# Patient Record
Sex: Female | Born: 1990 | Race: Black or African American | Hispanic: No | Marital: Single | Smoking: Never smoker
Health system: Southern US, Community
[De-identification: ages and names within clinical notes are randomized; demographics above are authoritative.]

## PROBLEM LIST (undated history)

## (undated) DIAGNOSIS — Z789 Other specified health status: Secondary | ICD-10-CM

## (undated) DIAGNOSIS — R112 Nausea with vomiting, unspecified: Secondary | ICD-10-CM

## (undated) DIAGNOSIS — Z9889 Other specified postprocedural states: Secondary | ICD-10-CM

## (undated) HISTORY — PX: NO PAST SURGERIES: SHX2092

## (undated) HISTORY — DX: Nausea with vomiting, unspecified: R11.2

## (undated) HISTORY — DX: Other specified postprocedural states: Z98.890

## (undated) HISTORY — DX: Nausea with vomiting, unspecified: Z98.890

---

## 2015-11-04 LAB — CBC
HCT: 43.7
HGB: 14.6 g/dL
MCV: 87
PLATELETS: 249
RBC: 5.03
RDW: 12.8
WBC: 5.4

## 2016-01-04 ENCOUNTER — Ambulatory Visit: Payer: Medicaid Other | Attending: Family Medicine | Admitting: Family Medicine

## 2016-01-04 ENCOUNTER — Encounter: Payer: Self-pay | Admitting: Family Medicine

## 2016-01-04 VITALS — BP 115/75 | HR 93 | Temp 98.6°F | Resp 16 | Wt 137.0 lb

## 2016-01-04 DIAGNOSIS — I8390 Asymptomatic varicose veins of unspecified lower extremity: Secondary | ICD-10-CM | POA: Insufficient documentation

## 2016-01-04 DIAGNOSIS — I8393 Asymptomatic varicose veins of bilateral lower extremities: Secondary | ICD-10-CM

## 2016-01-04 DIAGNOSIS — N926 Irregular menstruation, unspecified: Secondary | ICD-10-CM | POA: Diagnosis not present

## 2016-01-04 DIAGNOSIS — R1013 Epigastric pain: Secondary | ICD-10-CM | POA: Insufficient documentation

## 2016-01-04 HISTORY — DX: Asymptomatic varicose veins of unspecified lower extremity: I83.90

## 2016-01-04 LAB — POCT URINE PREGNANCY: Preg Test, Ur: NEGATIVE

## 2016-01-04 MED ORDER — ACETAMINOPHEN ER 650 MG PO TBCR: 650.0000 mg | EXTENDED_RELEASE_TABLET | Freq: Three times a day (TID) | ORAL | Status: DC | PRN

## 2016-01-04 MED ORDER — OMEPRAZOLE 40 MG PO CPDR: 40.0000 mg | DELAYED_RELEASE_CAPSULE | Freq: Every day | ORAL | Status: DC

## 2016-01-04 NOTE — Assessment & Plan Note (Signed)
Painful varicose veins Tylenol Weight loss via exercise

## 2016-01-04 NOTE — Progress Notes (Signed)
Used [employer] Interpreter [place]# [id number] Pain on legs x 1 year or more  Pain scale # unsure  No tobacco user

## 2016-01-04 NOTE — Patient Instructions (Signed)
Sarah Koch was seen today for abdominal pain.  Diagnoses and all orders for this visit:  Irregular menses -     POCT urine pregnancy  Abdominal pain, epigastric -     omeprazole (PRILOSEC) 40 MG capsule; Take 1 capsule (40 mg total) by mouth daily. -     H. pylori breath test -     COMPLETE METABOLIC PANEL WITH GFR -     Lipase  Varicose vein of leg -     acetaminophen (TYLENOL 8 HOUR) 650 MG CR tablet; Take 1 tablet (650 mg total) by mouth every 8 (eight) hours as needed for pain.    F/u in 6 weeks for GERD  Dr. Armen PickupFunches   Food Choices for Gastroesophageal Reflux Disease, Adult When you have gastroesophageal reflux disease (GERD), the foods you eat and your eating habits are very important. Choosing the right foods can help ease the discomfort of GERD. WHAT GENERAL GUIDELINES DO I NEED TO FOLLOW?  Choose fruits, vegetables, whole grains, low-fat dairy products, and low-fat meat, fish, and poultry.  Limit fats such as oils, salad dressings, butter, nuts, and avocado.  Keep a food diary to identify foods that cause symptoms.  Avoid foods that cause reflux. These may be different for different people.  Eat frequent small meals instead of three large meals each day.  Eat your meals slowly, in a relaxed setting.  Limit fried foods.  Cook foods using methods other than frying.  Avoid drinking alcohol.  Avoid drinking large amounts of liquids with your meals.  Avoid bending over or lying down until 2-3 hours after eating. WHAT FOODS ARE NOT RECOMMENDED? The following are some foods and drinks that may worsen your symptoms: Vegetables Tomatoes. Tomato juice. Tomato and spaghetti sauce. Chili peppers. Onion and garlic. Horseradish. Fruits Oranges, grapefruit, and lemon (fruit and juice). Meats High-fat meats, fish, and poultry. This includes hot dogs, ribs, ham, sausage, salami, and bacon. Dairy Whole milk and chocolate milk. Sour cream. Cream. Butter. Ice cream. Cream  cheese.  Beverages Coffee and tea, with or without caffeine. Carbonated beverages or energy drinks. Condiments Hot sauce. Barbecue sauce.  Sweets/Desserts Chocolate and cocoa. Donuts. Peppermint and spearmint. Fats and Oils High-fat foods, including [place] fries and potato chips. Other Vinegar. Strong spices, such as black pepper, white pepper, red pepper, cayenne, curry powder, cloves, ginger, and chili powder. The items listed above may not be a complete list of foods and beverages to avoid. Contact your dietitian for more information.   This information is not intended to replace advice given to you by your health care provider. Make sure you discuss any questions you have with your health care provider.   Document Released: 09/04/2005 Document Revised: 09/25/2014 Document Reviewed: 07/09/2013 Elsevier Interactive Patient Education [employer]2016 Elsevier Inc.

## 2016-01-04 NOTE — Progress Notes (Signed)
LOGO@  Subjective:  Patient ID: Sarah Koch, female    DOB: 10/23/1990  Age: 25 y.o. MRN: [id number]  [place]rinian interpreter used   CC: Abdominal Pain   HPI Sarah StairsFerub Tsuchiya has no significant past medical history. She [place] from [place] [facility] been [facility]. She comes today with a copy of the referral and her CBC. She complains of:   1. Abdominal pain: 1 year. Epigastric pain. She has had vomiting and nausea. She has pain everyday. Pain is exacerbated by eating. She is from [place] originally and has lived in [place], [place] for 3 months. Prior to moving to [place] she has lived in [place]. Pain is better with time. She last vomited today. Yellow in color. Small volume.   2. L calf pain: she denies injury. She has varicose veins. She has been gaining weight. She denies L knee swelling. She has not tried treatment.   No past medical history on file.  No past surgical history on file.  No family history on file.  Social History  Substance Use Topics  . Smoking status: Never Smoker   . Smokeless tobacco: Not on file  . Alcohol Use: No    ROS Review of Systems  Constitutional: Negative for fever and chills.  Eyes: Negative for visual disturbance.  Respiratory: Positive for cough. Negative for shortness of breath.   Cardiovascular: Negative for chest pain.  Gastrointestinal: Positive for abdominal pain. Negative for blood in stool.  Musculoskeletal: Positive for myalgias and back pain. Negative for arthralgias.  Skin: Negative for rash.  Allergic/Immunologic: Negative for immunocompromised state.  Hematological: Negative for adenopathy. Does not bruise/bleed easily.  Psychiatric/Behavioral: Negative for suicidal ideas and dysphoric mood.    Objective:  Today's Vitals: BP 115/75 mmHg  Pulse 93  Temp(Src) 98.6 F (37 C) (Oral)  Resp 16  Wt 137 lb (62.143 kg)  SpO2 99%  LMP 10/12/2015  Physical Exam  Constitutional: She is  oriented to person, place, and time. She appears well-developed and well-nourished. No distress.  HENT:  Head: Normocephalic and atraumatic.  Cardiovascular: Normal rate, regular rhythm, normal heart sounds and intact distal pulses.   Pulmonary/Chest: Effort normal and breath sounds normal.  Musculoskeletal: She exhibits no edema.  Neurological: She is alert and oriented to person, place, and time.  Skin: Skin is warm and dry. No rash noted.     Psychiatric: She has a normal mood and affect.   U preg: negative   Assessment & Plan:   Problem List Items Addressed This Visit    Varicose vein of leg   Relevant Medications   acetaminophen (TYLENOL 8 HOUR) 650 MG CR tablet   Abdominal pain, epigastric   Relevant Medications   omeprazole (PRILOSEC) 40 MG capsule   Other Relevant Orders   H. pylori breath test   COMPLETE METABOLIC PANEL WITH GFR   Lipase    Other Visit Diagnoses    Irregular menses    -  Primary    Relevant Orders    POCT urine pregnancy (Completed)       No outpatient encounter prescriptions on file as of 01/04/2016.   No facility-administered encounter medications on file as of 01/04/2016.    Follow-up: No Follow-up on file.    Dessa PhiJosalyn Makaylin Carlo MD

## 2016-01-04 NOTE — Assessment & Plan Note (Signed)
Suspect GERD H pylori breath test done today Add prilosec Info on GERD diet

## 2016-01-05 LAB — COMPLETE METABOLIC PANEL WITH GFR
ALBUMIN: 4.3 g/dL (ref 3.6–5.1)
ALK PHOS: 59 U/L (ref 33–115)
ALT: 11 U/L (ref 6–29)
AST: 15 U/L (ref 10–30)
BILIRUBIN TOTAL: 0.3 mg/dL (ref 0.2–1.2)
BUN: 8 mg/dL (ref 7–25)
CALCIUM: 9.3 mg/dL (ref 8.6–10.2)
CO2: 27 mmol/L (ref 20–31)
CREATININE: 0.63 mg/dL (ref 0.50–1.10)
Chloride: 102 mmol/L (ref 98–110)
GFR, Est African American: >89 mL/min (ref >=60)
GFR, Est Non African American: >89 mL/min (ref >=60)
Glucose, Bld: 88 mg/dL (ref 65–99)
Potassium: 4.2 mmol/L (ref 3.5–5.3)
SODIUM: 139 mmol/L (ref 135–146)
TOTAL PROTEIN: 7.3 g/dL (ref 6.1–8.1)

## 2016-01-05 LAB — LIPASE: LIPASE: 45 U/L (ref 7–60)

## 2016-01-05 LAB — H. PYLORI BREATH TEST: H. pylori Breath Test: NOT DETECTED

## 2016-01-06 ENCOUNTER — Telehealth: Payer: Self-pay | Admitting: *Deleted

## 2016-01-06 NOTE — Telephone Encounter (Signed)
Used [employer] Interpreter [place] [id number]#[id number] Date of birth verified by pt  Normal lab results given  Pt verbalized understanding

## 2016-01-06 NOTE — Telephone Encounter (Signed)
-----   Message from Dessa PhiJosalyn Funches, MD sent at 01/05/2016  1:11 PM EDT ----- All labs normal

## 2018-02-26 ENCOUNTER — Encounter (HOSPITAL_COMMUNITY): Payer: Self-pay | Admitting: Emergency Medicine

## 2018-02-26 ENCOUNTER — Ambulatory Visit (HOSPITAL_COMMUNITY)
Admission: EM | Admit: 2018-02-26 | Discharge: 2018-02-26 | Disposition: A | Discharge status: Home / Self Care | Payer: BLUE CROSS/BLUE SHIELD | Attending: Family Medicine | Admitting: Family Medicine

## 2018-02-26 ENCOUNTER — Other Ambulatory Visit: Payer: Self-pay

## 2018-02-26 DIAGNOSIS — N926 Irregular menstruation, unspecified: Secondary | ICD-10-CM

## 2018-02-26 DIAGNOSIS — L74510 Primary focal hyperhidrosis, axilla: Secondary | ICD-10-CM

## 2018-02-26 MED ORDER — ALUMINUM CHLORIDE HEXAHYDRATE CRYS: CRYSTALS | 0 refills | Status: DC

## 2018-02-26 NOTE — ED Provider Notes (Signed)
[facility]    CSN: [id number] Arrival date & time: 02/26/18  1752     History   Chief Complaint Chief Complaint  Patient presents with  . Abscess    HPI Sarah Koch is a 27 y.o. female.   Sarah Koch presents with family with complaints of excessive moisture and odor to bilateral armpits. Has been ongoing for two months. Denies any previous similar. No fevers. Occasionally uses antiperspirant/deodorant. States also has irregular periods. Has never been sexually active. No period in two months. Has had this is the past. No change. No pain. Does not follow with a PCP. Without contributing medical history.      ROS per HPI.      History reviewed. No pertinent past medical history.  Patient Active Problem List   Diagnosis Date Noted  . Abdominal pain, epigastric 01/04/2016  . Varicose vein of leg 01/04/2016    History reviewed. No pertinent surgical history.  OB History   None      Home Medications    Prior to Admission medications   Medication Sig Start Date End Date Taking? Authorizing Provider  Aluminum Chloride Hexahydrate CRYS Apply nightly to armpits 02/26/18   Georgetta HaberBurky, Natalie B, NP    Family History History reviewed. No pertinent family history.  Social History Social History   Tobacco Use  . Smoking status: Never Smoker  . Smokeless tobacco: Never Used  Substance Use Topics  . Alcohol use: No    Alcohol/week: 0.0 oz  . Drug use: No     Allergies   Patient has no known allergies.   Review of Systems Review of Systems   Physical Exam Triage Vital Signs ED Triage Vitals  Enc Vitals Group     BP 02/26/18 1924 125/67     Pulse Rate 02/26/18 1924 84     Resp 02/26/18 1924 18     Temp 02/26/18 1924 99.8 F (37.7 C)     Temp Source 02/26/18 1924 Oral     SpO2 02/26/18 1924 100 %     Weight --      Height --      Head Circumference --      Peak Flow --      Pain Score 02/26/18 1925 4     Pain Loc --      Pain Edu? --    Excl. in GC? --    No data found.  Updated Vital Signs BP 125/67 (BP Location: Left Arm)   Pulse 84   Temp 99.8 F (37.7 C) (Oral)   Resp 18   LMP 01/01/2018   SpO2 100%    Physical Exam  Constitutional: She is oriented to person, place, and time. She appears well-developed and well-nourished. No distress.  Cardiovascular: Normal rate, regular rhythm and normal heart sounds.  Pulmonary/Chest: Effort normal and breath sounds normal.  Bilateral axilla without any acute findings. Non tender, no masses, no redness or skin breakdown   Neurological: She is alert and oriented to person, place, and time.  Skin: Skin is warm and dry.     UC Treatments / Results  Labs (all labs ordered are listed, but only abnormal results are displayed) Labs Reviewed - No data to display  EKG None  Radiology No results found.  Procedures Procedures (including critical care time)  Medications Ordered in UC Medications - No data to display  Initial Impression / Assessment and Plan / UC Course  I have reviewed the triage vital signs and the  nursing notes.  Pertinent labs & imaging results that were available during my care of the patient were reviewed by me and considered in my medical decision making (see chart for details).     Encouraged regular use of deodorant. May try nightly aluminum chloride hexahydrate to better control sweating and odor. Patient quite adamant that she is not sexually active, has historically had irregular periods, no urine pregnancy tonight. Encouraged follow up for recheck with pcp. Patient verbalized understanding and agreeable to plan.  Ambulatory out of clinic without difficulty.   Final Clinical Impressions(s) / UC Diagnoses   Final diagnoses:  Hyperhidrosis of axilla  Irregular menstrual cycle     Discharge Instructions     May try prescribed medication to help with odor and sweating, apply nightly.  Please follow up with primary care provider for  recheck and for recheck of menstrual patterns.    ED Prescriptions    Medication Sig Dispense Auth. Provider   Aluminum Chloride Hexahydrate CRYS Apply nightly to armpits 25 g Linus Mako B, NP     Controlled Substance Prescriptions [place] Controlled Substance Registry consulted? Not Applicable   Georgetta Haber, NP 02/26/18 1947

## 2018-02-26 NOTE — ED Triage Notes (Signed)
[facility] with a complaint of a possible abscess under her right and left arms x 2 months.

## 2018-02-26 NOTE — Discharge Instructions (Addendum)
May try prescribed medication to help with odor and sweating, apply nightly.  Please follow up with primary care provider for recheck and for recheck of menstrual patterns.

## 2018-02-27 ENCOUNTER — Telehealth (HOSPITAL_COMMUNITY): Payer: Self-pay | Admitting: Emergency Medicine

## 2018-02-27 MED ORDER — ALUMINUM CHLORIDE HEXAHYDRATE CRYS: CRYSTALS | 0 refills | Status: DC

## 2018-02-27 NOTE — Telephone Encounter (Signed)
RX sent to [facility] per pt request

## 2018-10-21 DIAGNOSIS — L74510 Primary focal hyperhidrosis, axilla: Secondary | ICD-10-CM | POA: Insufficient documentation

## 2018-10-21 HISTORY — DX: Primary focal hyperhidrosis, axilla: L74.510

## 2020-12-19 ENCOUNTER — Ambulatory Visit (HOSPITAL_COMMUNITY)
Admission: EM | Admit: 2020-12-19 | Discharge: 2020-12-19 | Disposition: A | Discharge status: Other Acute Inpatient Hospital | Payer: BC Managed Care – PPO | Attending: Internal Medicine | Admitting: Internal Medicine

## 2020-12-19 ENCOUNTER — Inpatient Hospital Stay (HOSPITAL_COMMUNITY)
Admission: AD | Admit: 2020-12-19 | Discharge: 2020-12-19 | Disposition: A | Discharge status: Home / Self Care | Payer: BC Managed Care – PPO | Attending: Obstetrics and Gynecology | Admitting: Obstetrics and Gynecology

## 2020-12-19 ENCOUNTER — Inpatient Hospital Stay (HOSPITAL_COMMUNITY): Payer: BC Managed Care – PPO

## 2020-12-19 ENCOUNTER — Encounter (HOSPITAL_COMMUNITY): Payer: Self-pay | Admitting: Obstetrics and Gynecology

## 2020-12-19 DIAGNOSIS — O034 Incomplete spontaneous abortion without complication: Secondary | ICD-10-CM

## 2020-12-19 DIAGNOSIS — O26859 Spotting complicating pregnancy, unspecified trimester: Secondary | ICD-10-CM

## 2020-12-19 DIAGNOSIS — Z3A11 11 weeks gestation of pregnancy: Secondary | ICD-10-CM | POA: Diagnosis not present

## 2020-12-19 DIAGNOSIS — O26891 Other specified pregnancy related conditions, first trimester: Secondary | ICD-10-CM | POA: Insufficient documentation

## 2020-12-19 DIAGNOSIS — Z3201 Encounter for pregnancy test, result positive: Secondary | ICD-10-CM

## 2020-12-19 DIAGNOSIS — O99891 Other specified diseases and conditions complicating pregnancy: Secondary | ICD-10-CM

## 2020-12-19 DIAGNOSIS — M549 Dorsalgia, unspecified: Secondary | ICD-10-CM | POA: Insufficient documentation

## 2020-12-19 DIAGNOSIS — Z3A12 12 weeks gestation of pregnancy: Secondary | ICD-10-CM | POA: Diagnosis not present

## 2020-12-19 DIAGNOSIS — B9689 Other specified bacterial agents as the cause of diseases classified elsewhere: Secondary | ICD-10-CM

## 2020-12-19 DIAGNOSIS — O209 Hemorrhage in early pregnancy, unspecified: Secondary | ICD-10-CM | POA: Diagnosis present

## 2020-12-19 DIAGNOSIS — Z789 Other specified health status: Secondary | ICD-10-CM

## 2020-12-19 LAB — WET PREP, GENITAL
Sperm: NONE SEEN
Trich, Wet Prep: NONE SEEN
Yeast Wet Prep HPF POC: NONE SEEN

## 2020-12-19 LAB — URINALYSIS, ROUTINE W REFLEX MICROSCOPIC
Bilirubin Urine: NEGATIVE
Glucose, UA: NEGATIVE mg/dL
Hgb urine dipstick: NEGATIVE
Ketones, ur: NEGATIVE mg/dL
Nitrite: NEGATIVE
Protein, ur: NEGATIVE mg/dL
Specific Gravity, Urine: 1.009 (ref 1.005–1.030)
pH: 7 (ref 5.0–8.0)

## 2020-12-19 LAB — CBC
HCT: 41.2 % (ref 36.0–46.0)
Hemoglobin: 13.8 g/dL (ref 12.0–15.0)
MCH: 27.9 pg (ref 26.0–34.0)
MCHC: 33.5 g/dL (ref 30.0–36.0)
MCV: 83.2 fL (ref 80.0–100.0)
Platelets: 224 10*3/uL (ref 150–400)
RBC: 4.95 MIL/uL (ref 3.87–5.11)
RDW: 13 % (ref 11.5–15.5)
WBC: 4.5 10*3/uL (ref 4.0–10.5)
nRBC: 0 % (ref 0.0–0.2)

## 2020-12-19 LAB — POC URINE PREG, ED: Preg Test, Ur: POSITIVE — AB

## 2020-12-19 LAB — POCT URINALYSIS DIPSTICK, ED / UC
Bilirubin Urine: NEGATIVE
Glucose, UA: NEGATIVE mg/dL
Nitrite: NEGATIVE
Protein, ur: NEGATIVE mg/dL
Specific Gravity, Urine: 1.025 (ref 1.005–1.030)
Urobilinogen, UA: 0.2 mg/dL (ref 0.0–1.0)
pH: 7 (ref 5.0–8.0)

## 2020-12-19 LAB — ABO/RH: ABO/RH(D): B POS

## 2020-12-19 LAB — HIV ANTIBODY (ROUTINE TESTING W REFLEX): HIV Screen 4th Generation wRfx: NONREACTIVE

## 2020-12-19 LAB — HCG, QUANTITATIVE, PREGNANCY: hCG, Beta Chain, Quant, S: 13067 m[IU]/mL — ABNORMAL HIGH (ref <5)

## 2020-12-19 MED ORDER — IBUPROFEN 600 MG PO TABS: 600.0000 mg | ORAL_TABLET | Freq: Four times a day (QID) | ORAL | 0 refills | Status: DC | PRN

## 2020-12-19 MED ORDER — METRONIDAZOLE 500 MG PO TABS: 500.0000 mg | ORAL_TABLET | Freq: Two times a day (BID) | ORAL | 0 refills | Status: DC

## 2020-12-19 MED ORDER — ACETAMINOPHEN-CODEINE #3 300-30 MG PO TABS: 1.0000 | ORAL_TABLET | Freq: Four times a day (QID) | ORAL | 0 refills | Status: DC | PRN

## 2020-12-19 MED ORDER — PROMETHAZINE HCL 12.5 MG PO TABS: 12.5000 mg | ORAL_TABLET | Freq: Four times a day (QID) | ORAL | 0 refills | Status: DC | PRN

## 2020-12-19 NOTE — MAU Provider Note (Signed)
History     CSN: [id number]  Arrival date and time: 12/19/20 1813   Event Date/Time   First Provider Initiated Contact with Patient 12/19/20 2002      Chief Complaint  Patient presents with  . Vaginal Bleeding  . Back Pain   [name] is a 30 y.o. year old G1P0 female at [redacted]w[redacted]d weeks gestation who presents to MAU reporting intermittent vaginal bleeding; "not a lot." She reports passing a quarter-sized blood clot earlier today. She also report LT back pain, cramps and pressure. All of her sx's started Wednesday. She denies recent SI.   OB History    Gravida  1   Para      Term      Preterm      AB      Living        SAB      IAB      Ectopic      Multiple      Live Births              History reviewed. No pertinent past medical history.  History reviewed. No pertinent surgical history.  History reviewed. No pertinent family history.  Social History   Tobacco Use  . Smoking status: Never Smoker  . Smokeless tobacco: Never Used  Vaping Use  . Vaping Use: Never used  Substance Use Topics  . Alcohol use: No    Alcohol/week: 0.0 standard drinks  . Drug use: No    Allergies: No Known Allergies  Medications Prior to Admission  Medication Sig Dispense Refill Last Dose  . ferrous sulfate 324 MG TBEC Take 324 mg by mouth.   12/19/2020 at Unknown time  . Aluminum Chloride Hexahydrate CRYS Apply nightly to armpits 25 g 0     Review of Systems  Constitutional: Negative.   HENT: Negative.   Eyes: Negative.   Respiratory: Negative.   Cardiovascular: Negative.   Gastrointestinal: Negative.   Endocrine: Negative.   Genitourinary: Positive for pelvic pain (cramps) and vaginal bleeding (passed quarter-sized blood clort earlier; not alot of bleeding now).  Musculoskeletal: Positive for back pain.  Skin: Negative.   Allergic/Immunologic: Negative.   Neurological: Negative.   Hematological: Negative.   Psychiatric/Behavioral: Negative.     Physical Exam   Blood pressure 109/72, pulse 100, temperature 98.3 F (36.8 C), temperature source Oral, resp. rate 16, height 5' 2.99" (1.6 m), weight 81.8 kg, last menstrual period 09/27/2020, SpO2 100 %.  Physical Exam Vitals and nursing note reviewed. Exam conducted with a chaperone present.  Constitutional:      Appearance: Normal appearance. She is obese.  Abdominal:     Palpations: Abdomen is soft.  Genitourinary:    Comments: Pelvic exam: External genitalia normal, SE: vaginal walls pink and well rugated, cervix is smooth, pink, no lesions, small amt of thick, white vaginal d/c -- WP, GC/CT done, cervix visually closed with some mucousy blood coming from os, Uterus is non-tender, no CMT or friability, no adnexal tenderness.  Musculoskeletal:        General: Normal range of motion.  Skin:    General: Skin is warm and dry.  Neurological:     Mental Status: She is alert.  Psychiatric:        Mood and Affect: Mood normal.        Behavior: Behavior normal.        Thought Content: Thought content normal.        Judgment: Judgment normal.  Unable to obtain FHTs with doppler MAU Course  Procedures  MDM CCUA UPT CBC ABO/Rh HCG Wet Prep GC/CT -- pending HIV -- pending OB < 14 wks [place] with TV Offered Cytotec or expectant management -- patient chooses expectant management  Results for orders placed or performed during the hospital encounter of 12/19/20 (from the past 24 hour(s))  CBC     Status: None   Collection Time: 12/19/20  8:09 PM  Result Value Ref Range   WBC 4.5 4.0 - 10.5 K/uL   RBC 4.95 3.87 - 5.11 MIL/uL   Hemoglobin 13.8 12.0 - 15.0 g/dL   HCT 84.6 96.2 - 95.2 %   MCV 83.2 80.0 - 100.0 fL   MCH 27.9 26.0 - 34.0 pg   MCHC 33.5 30.0 - 36.0 g/dL   RDW 84.1 32.4 - 40.1 %   Platelets 224 150 - 400 K/uL   nRBC 0.0 0.0 - 0.2 %  ABO/Rh     Status: None   Collection Time: 12/19/20  8:09 PM  Result Value Ref Range   ABO/RH(D) B POS    No rh immune  globuloin      NOT A RH IMMUNE GLOBULIN CANDIDATE, PT RH POSITIVE Performed at [facility], [address] [place], [place] [address]   Wet prep, genital     Status: Abnormal   Collection Time: 12/19/20  8:23 PM   Specimen: Urine, Clean Catch  Result Value Ref Range   Yeast Wet Prep HPF POC NONE SEEN NONE SEEN   Trich, Wet Prep NONE SEEN NONE SEEN   Clue Cells Wet Prep HPF POC PRESENT (A) NONE SEEN   WBC, Wet Prep HPF POC MANY (A) NONE SEEN   Sperm NONE SEEN   Urinalysis, Routine w reflex microscopic Urine, Clean Catch     Status: Abnormal   Collection Time: 12/19/20  8:49 PM  Result Value Ref Range   Color, Urine YELLOW YELLOW   APPearance HAZY (A) CLEAR   Specific Gravity, Urine 1.009 1.005 - 1.030   pH 7.0 5.0 - 8.0   Glucose, UA NEGATIVE NEGATIVE mg/dL   Hgb urine dipstick NEGATIVE NEGATIVE   Bilirubin Urine NEGATIVE NEGATIVE   Ketones, ur NEGATIVE NEGATIVE mg/dL   Protein, ur NEGATIVE NEGATIVE mg/dL   Nitrite NEGATIVE NEGATIVE   Leukocytes,Ua MODERATE (A) NEGATIVE   RBC / HPF 0-5 0 - 5 RBC/hpf   WBC, UA 6-10 0 - 5 WBC/hpf   Bacteria, UA RARE (A) NONE SEEN   Squamous Epithelial / LPF 11-20 0 - 5   Mucus PRESENT     US OB Comp Less 14 Wks  Result Date: 12/19/2020 CLINICAL DATA:  Vaginal bleeding and back pain. EXAM: OBSTETRIC <14 WK ULTRASOUND TECHNIQUE: Transabdominal ultrasound was performed for evaluation of the gestation as well as the maternal uterus and adnexal regions. COMPARISON:  None. FINDINGS: Intrauterine gestational sac: Single Yolk sac:  Not Visualized. Embryo:  Visualized. Cardiac Activity: Not Visualized. Heart Rate: N/A bpm CRL:   13.5 mm   7 w 4 d                  [place] [facility]: August 03, 2021 Subchorionic hemorrhage:  Small. Maternal uterus/adnexae: The right and left ovaries are not clearly visualized. No pelvic free fluid is seen. IMPRESSION: Single, nonviable intrauterine pregnancy, at approximately 7 weeks and 4 days gestation by ultrasound evaluation.  Electronically Signed   By: Aram Candela M.D.   On: 12/19/2020 20:58  Assessment and Plan  Incomplete miscarriage - Discussed will have bleeding and cramping until pregnancy has passed on it's own - Advised to return to MAU for increased VB [redacted] weeks gestation of pregnancy  Back pain affecting pregnancy in first trimester - Advised to take Ibuprofen or Tylenol #3 prn pain  Spotting in early pregnancy - Explained return to MAU:  If you have heavier bleeding that soaks through more that 2 pads per hour for an hour or more  If you bleed so much that you feel like you might pass out or you do pass out  If you have significant abdominal pain that is not improved with Tylenol 1000 mg every 6 hours as needed for pain  If you develop a fever > 100.5   Language barrier affecting health care - [employer], #[id number] used for entire visit   Raelyn Mora, CNM 12/19/2020, 8:02 PM

## 2020-12-19 NOTE — ED Triage Notes (Signed)
Pt presented with reported 2 months pregnant  And having Vag bleeding. Pt gave urine for Pregnancy  test . Results positive . [facility]

## 2020-12-19 NOTE — MAU Note (Signed)
..  Sarah Koch is a 30 y.o. at Unknown here in MAU reporting: Pregnant and there is vaginal bleeding that comes and goes. She says that it is not a lot of bleeding but she had a quartersized clot come out earlier today. Denies abdominal pain but does have left sided back pain that feels like cramps and pressure.  LMP: 09/27/2020 Onset of complaint: Wednesday Pain score: 5/10 Vitals:   12/19/20 1928  BP: 109/72  Pulse: 100  Resp: 16  Temp: 98.3 F (36.8 C)  SpO2: 100%     FHR: Attempted to doppler unable to auscultate FHR provider notified.  Lab orders placed from triage: UA

## 2020-12-20 LAB — GC/CHLAMYDIA PROBE AMP (~~LOC~~) NOT AT ARMC
Chlamydia: NEGATIVE
Comment: NEGATIVE
Comment: NORMAL
Neisseria Gonorrhea: NEGATIVE

## 2020-12-27 ENCOUNTER — Other Ambulatory Visit: Payer: BC Managed Care – PPO

## 2020-12-29 ENCOUNTER — Other Ambulatory Visit: Payer: Self-pay

## 2020-12-29 ENCOUNTER — Other Ambulatory Visit: Payer: BC Managed Care – PPO

## 2020-12-29 DIAGNOSIS — O034 Incomplete spontaneous abortion without complication: Secondary | ICD-10-CM

## 2020-12-30 LAB — BETA HCG QUANT (REF LAB): hCG Quant: 75 m[IU]/mL

## 2021-01-03 ENCOUNTER — Encounter: Payer: Self-pay | Admitting: Advanced Practice Midwife

## 2021-01-03 ENCOUNTER — Ambulatory Visit (INDEPENDENT_AMBULATORY_CARE_PROVIDER_SITE_OTHER): Payer: BC Managed Care – PPO | Admitting: Advanced Practice Midwife

## 2021-01-03 ENCOUNTER — Other Ambulatory Visit: Payer: Self-pay

## 2021-01-03 ENCOUNTER — Telehealth: Payer: Self-pay | Admitting: Lactation Services

## 2021-01-03 VITALS — BP 124/80 | HR 87 | Wt 179.6 lb

## 2021-01-03 DIAGNOSIS — O034 Incomplete spontaneous abortion without complication: Secondary | ICD-10-CM

## 2021-01-03 NOTE — Telephone Encounter (Signed)
Called patient with assistance of [employer] [place], [place] # [phone].   Patient did not answer. LM for patient to call the office for results.   Patient was seen at [facility] today and Hcg was repeated at that time. Will forward message to [facility].

## 2021-01-03 NOTE — Telephone Encounter (Signed)
-----   Message from Warden Fillers, MD sent at 01/03/2021  9:07 AM EDT ----- Bhcg decreased to 75, advise recheck in 2 weeks

## 2021-01-03 NOTE — Patient Instructions (Signed)
Miscarriage A miscarriage is the loss of a pregnancy before the 20th week of pregnancy. Sometimes, a pregnancy ends before a woman knows that she is pregnant. If you lose a pregnancy, talk with your doctor about:  Questions you have about the loss of your baby.  How to work through your grief.  Plans for future pregnancy. What are the causes? Many times, the cause of this condition is not known. What increases the risk? These things may make a pregnant woman more likely to lose a pregnancy: Certain health conditions  Conditions that affect hormones, such as: ? Thyroid disease. ? Polycystic ovary syndrome.  Diabetes.  A disease that causes the body's disease-fighting system to attack itself by mistake.  Infections.  Bleeding problems.  Being very overweight. Lifestyle factors  Using products that have tobacco or nicotine in them.  Being around tobacco smoke.  Having alcohol.  Having a lot of caffeine.  Using drugs. Problems with reproductive organs or parts  Having a cervix that opens and thins before your due date. The cervix is the lowest part of your womb.  Having Asherman syndrome, which leads to: ? Scars in the womb. ? The womb being abnormal in shape.  Growths (fibroids) in the womb.  Problems in the body that are present at birth.  Infection of the cervix or womb. Personal or health history  Injury.  Having lost a pregnancy before.  Being younger than age 18 or older than age 35.  Being around a harmful substance, such as radiation.  Having lead or other heavy metals in: ? Things you eat or drink. ? The air around you.  Using certain medicines. What are the signs or symptoms?  Blood or spots of blood coming from the vagina. You may also have cramps or pain.  Pain or cramps in the belly or low back.  Fluid or tissue coming out of the vagina. How is this treated? Sometimes, treatment is not needed. If you need treatment, you may be  treated with:  A procedure to open the cervix more and take tissue out of the womb.  Medicines. You may get a shot of medicine called Rho(D) immune globulin. Follow these instructions at home: Medicines  Take over-the-counter and prescription medicines only as told by your doctor.  If you were prescribed antibiotic medicine, take it as told by your doctor. Do not stop taking it even if you start to feel better. Activity  Rest as told by your doctor. Ask your doctor what activities are safe for you.  Have someone help you at home during this time. General instructions  Watch how much tissue comes out of the vagina.  Watch the size of any blood clots that come out of the vagina.  Do not have sex or douche until your doctor says it is okay.  Do not put things, such as tampons, in your vagina until your doctor says it is okay.  To help you and your partner with grieving: ? Talk with your doctor. ? See a counselor.  When you are ready, talk with your doctor about: ? Things to do for your health. ? How you can be healthy if you get pregnant again.  Keep all follow-up visits.   Where to find more information  The American College of Obstetricians and Gynecologists: acog.org  [place] Department of Health and Human Services Office of Women's Health: hrsa.gov/office-womens-health Contact a doctor if:  You have a fever or chills.  There is bad-smelling fluid coming   from the vagina.  You have more bleeding.  Tissue or clots of blood come out of your vagina. Get help right away if:  You have very bad cramps or pain in your back or belly.  You soak more than 2 large pads in an hour for more than 2 hours.  You get light-headed or weak.  You faint.  You feel sad, and you have sad thoughts a lot of the time.  You think about hurting yourself. Get help right awayif you feel like you may hurt yourself or others, or have thoughts about taking your own life. Go to your nearest  emergency room or:  Call your local emergency services (911 in the [place]  Call the National Suicide Prevention Lifeline at 1-800-273-8255. This is open 24 hours a day.  Text the Crisis Text Line at 741741. Summary  A miscarriage is the loss of a pregnancy before the 20th week of pregnancy. Sometimes, a pregnancy ends before a woman knows that she is pregnant.  Follow instructions from your doctor about medicines and activity.  To help you and your partner with grieving, talk with your doctor or a counselor.  Keep all follow-up visits. This information is not intended to replace advice given to you by your health care provider. Make sure you discuss any questions you have with your health care provider. Document Revised: 03/05/2020 Document Reviewed: 03/05/2020 Elsevier Patient Education  2021 Elsevier Inc.  

## 2021-01-03 NOTE — Progress Notes (Signed)
OB/GYN PROBLEM ENCOUNTER NOTE  History:     Sarah Koch is a 30 y.o. G1P0 female here for followup to first trimester miscarriage. She is s/p miscarriage at [redacted] weeks GA. She states her bleeding resolved two days ago. Denies abnormal vaginal bleeding, discharge, pelvic pain, problems with intercourse or other gynecologic concerns.     Patient states she put herself bed rest on 12/16/2020. She is requesting a work note to allow her to continue staying home from work for three more weeks.  Gynecologic History Patient's last menstrual period was 09/27/2020. Contraception: none   Obstetric History OB History  Gravida Para Term Preterm AB Living  1            SAB IAB Ectopic Multiple Live Births               # Outcome Date GA Lbr Len/2nd Weight Sex Delivery Anes PTL Lv  1 Gravida             No past medical history on file.  No past surgical history on file.  Current Outpatient Medications on File Prior to Visit  Medication Sig Dispense Refill  . metroNIDAZOLE (FLAGYL) 500 MG tablet Take 1 tablet (500 mg total) by mouth 2 (two) times daily. 14 tablet 0  . acetaminophen-codeine (TYLENOL #3) 300-30 MG tablet Take 1-2 tablets by mouth every 6 (six) hours as needed for moderate pain. (Patient not taking: Reported on 01/03/2021) 15 tablet 0  . Aluminum Chloride Hexahydrate CRYS Apply nightly to armpits (Patient not taking: Reported on 01/03/2021) 25 g 0  . ferrous sulfate 324 MG TBEC Take 324 mg by mouth. (Patient not taking: Reported on 01/03/2021)    . ibuprofen (ADVIL) 600 MG tablet Take 1 tablet (600 mg total) by mouth every 6 (six) hours as needed for mild pain or moderate pain. (Patient not taking: Reported on 01/03/2021) 60 tablet 0  . promethazine (PHENERGAN) 12.5 MG tablet Take 1 tablet (12.5 mg total) by mouth every 6 (six) hours as needed for nausea or vomiting. (Patient not taking: Reported on 01/03/2021) 30 tablet 0   No current facility-administered medications on file prior  to visit.    No Known Allergies  Social History:  reports that she has never smoked. She has never used smokeless tobacco. She reports that she does not drink alcohol and does not use drugs.  No family history on file.  The following portions of the patient's history were reviewed and updated as appropriate: allergies, current medications, past family history, past medical history, past social history, past surgical history and problem list.  Review of Systems Pertinent items noted in HPI and remainder of comprehensive ROS otherwise negative.  Physical Exam:  BP 124/80   Pulse 87   Wt 179 lb 9.6 oz (81.5 kg)   LMP 09/27/2020   Breastfeeding Unknown   BMI 31.82 kg/m  CONSTITUTIONAL: Well-developed, well-nourished female in no acute distress.  HENT:  Normocephalic, atraumatic, External right and left ear normal.  EYES: Conjunctivae and EOM are normal. Pupils are equal, round, and reactive to light. No scleral icterus.  SKIN: Skin is warm and dry. No rash noted. Not diaphoretic. No erythema. No pallor. NEUROLOGIC: Alert and oriented to person, place, and time.  PSYCHIATRIC: Normal mood and affect. Normal behavior. Normal judgment and thought content. CARDIOVASCULAR: Normal heart rate noted RESPIRATORY: Effort normal Assessment and Plan:    1. Incomplete miscarriage - S/p quant hCG 75 on 12/29/2020 - No acute concerns (no  bleeding or pain) - No signs of increasing acuity today - Beta hCG quant (ref lab)  Discussed with patient that she is recovering well, does not have medical indication to be absent from work at this time. Letter provided to document MAU visit 12/19/2020.  Routine preventative health maintenance measures emphasized. Please refer to After Visit Summary for other counseling recommendations.      Clayton Bibles, MSN, CNM Certified Nurse Midwife, [employer] for [employer], [facility] 01/03/21 11:19 AM

## 2021-01-04 LAB — BETA HCG QUANT (REF LAB): hCG Quant: 19 m[IU]/mL

## 2021-01-05 ENCOUNTER — Telehealth: Payer: Self-pay | Admitting: *Deleted

## 2021-01-05 NOTE — Telephone Encounter (Signed)
Spoke with [employer] regarding disability claim. Needed dates of service and return to work date.  Notes reviewed and dates given.

## 2021-01-19 ENCOUNTER — Other Ambulatory Visit: Payer: BC Managed Care – PPO

## 2021-01-19 ENCOUNTER — Other Ambulatory Visit: Payer: Self-pay

## 2021-01-19 DIAGNOSIS — O034 Incomplete spontaneous abortion without complication: Secondary | ICD-10-CM

## 2021-01-20 LAB — BETA HCG QUANT (REF LAB): hCG Quant: 4 m[IU]/mL

## 2021-03-18 ENCOUNTER — Ambulatory Visit (INDEPENDENT_AMBULATORY_CARE_PROVIDER_SITE_OTHER): Payer: BC Managed Care – PPO

## 2021-03-18 ENCOUNTER — Other Ambulatory Visit: Payer: Self-pay

## 2021-03-18 VITALS — BP 112/85 | HR 82

## 2021-03-18 DIAGNOSIS — Z3201 Encounter for pregnancy test, result positive: Secondary | ICD-10-CM

## 2021-03-18 LAB — POCT URINE PREGNANCY: Preg Test, Ur: POSITIVE — AB

## 2021-03-18 NOTE — Progress Notes (Signed)
Sarah Koch presents today for UPT. She has no unusual complaints. LMP: 02/07/2021    OBJECTIVE: Appears well, in no apparent distress.  OB History     Gravida  1   Para      Term      Preterm      AB      Living         SAB      IAB      Ectopic      Multiple      Live Births             Home UPT Result: positive In-Office UPT result:positive I have reviewed the patient's medical, obstetrical, social, and family histories, and medications.   ASSESSMENT: Positive pregnancy test  PLAN Prenatal care to be completed SW:[id number] around 10 weeks.

## 2021-04-14 ENCOUNTER — Other Ambulatory Visit: Payer: Self-pay

## 2021-04-14 ENCOUNTER — Ambulatory Visit (INDEPENDENT_AMBULATORY_CARE_PROVIDER_SITE_OTHER): Payer: BC Managed Care – PPO

## 2021-04-14 VITALS — BP 124/80 | HR 106 | Ht 62.99 in | Wt 179.8 lb

## 2021-04-14 DIAGNOSIS — Z3481 Encounter for supervision of other normal pregnancy, first trimester: Secondary | ICD-10-CM

## 2021-04-14 DIAGNOSIS — O3680X0 Pregnancy with inconclusive fetal viability, not applicable or unspecified: Secondary | ICD-10-CM

## 2021-04-14 DIAGNOSIS — O0990 Supervision of high risk pregnancy, unspecified, unspecified trimester: Secondary | ICD-10-CM | POA: Insufficient documentation

## 2021-04-14 DIAGNOSIS — Z3A10 10 weeks gestation of pregnancy: Secondary | ICD-10-CM | POA: Diagnosis not present

## 2021-04-14 DIAGNOSIS — Z3491 Encounter for supervision of normal pregnancy, unspecified, first trimester: Secondary | ICD-10-CM | POA: Insufficient documentation

## 2021-04-14 NOTE — Progress Notes (Signed)
New OB Intake  I connected with  Sarah Koch on 04/14/21 at  3:15 PM EDT by in person. Video Visit and verified that I am speaking with the correct person using two identifiers. Nurse is located at [facility] and pt is located at [place].  I discussed the limitations, risks, security and privacy concerns of performing an evaluation and management service by telephone and the availability of in person appointments. I also discussed with the patient that there may be a patient responsible charge related to this service. The patient expressed understanding and agreed to proceed.  I explained I am completing New OB Intake today. We discussed her EDD of 11/08/21 that is based on early u/s. Pt is G2/P0010. I reviewed her allergies, medications, Medical/Surgical/OB history, and appropriate screenings. I informed her of [facility] services. Based on history, this is a/an  pregnancy uncomplicated .   Patient Active Problem List   Diagnosis Date Noted   Abdominal pain, epigastric 01/04/2016   Varicose vein of leg 01/04/2016    Concerns addressed today  Delivery Plans:  Plans to deliver at [facility].   MyChart/Babyscripts MyChart access verified. I explained pt will have some visits in office and some virtually. Babyscripts instructions given and order placed. Patient verifies receipt of registration text/e-mail. Account successfully created and app downloaded.  Blood Pressure Cuff  Patient has private insurance; instructed to purchase blood pressure cuff and bring to first prenatal appt. Explained after first prenatal appt pt will check weekly and document in Babyscripts.  Weight scale: Patient instructed to purchase scale.  Anatomy US Explained first scheduled [place] will be around 19 weeks. Dating and viability scan performed today.  Labs Discussed Avelina Laine genetic screening with patient. Would like both Panorama and Horizon drawn at new OB visit. Routine prenatal labs needed.  Covid Vaccine Patient has  covid vaccine.   Mother/ Baby Dyad Candidate?    If yes, offer as possibility  Informed patient of [facility] website  and placed link in her AVS.   Social Determinants of Health Food Insecurity: Patient denies food insecurity. WIC Referral: Patient is interested in referral to [facility].  Transportation: Patient denies transportation needs. Childcare: Discussed no children allowed at ultrasound appointments. Offered childcare services; patient declines childcare services at this time.   Placed OB Box on problem list and updated  First visit review I reviewed new OB appt with pt. I explained she will have a pelvic exam, ob bloodwork with genetic screening, and PAP smear. Explained pt will be seen by Sharen Counter at first visit; encounter routed to appropriate provider. Explained that patient will be seen by pregnancy navigator following visit with provider. [facility] information placed in AVS.   Hamilton Capri, RN 04/14/2021  3:43 PM

## 2021-04-25 ENCOUNTER — Ambulatory Visit (INDEPENDENT_AMBULATORY_CARE_PROVIDER_SITE_OTHER): Payer: BC Managed Care – PPO | Admitting: Advanced Practice Midwife

## 2021-04-25 ENCOUNTER — Other Ambulatory Visit: Payer: Self-pay

## 2021-04-25 ENCOUNTER — Encounter: Payer: Self-pay | Admitting: Obstetrics

## 2021-04-25 ENCOUNTER — Encounter: Payer: Self-pay | Admitting: Advanced Practice Midwife

## 2021-04-25 ENCOUNTER — Other Ambulatory Visit (HOSPITAL_COMMUNITY)
Admission: RE | Admit: 2021-04-25 | Discharge: 2021-04-25 | Disposition: A | Discharge status: Home / Self Care | Payer: BC Managed Care – PPO | Source: Ambulatory Visit | Attending: Advanced Practice Midwife | Admitting: Advanced Practice Midwife

## 2021-04-25 VITALS — BP 123/78 | HR 115 | Wt 179.0 lb

## 2021-04-25 DIAGNOSIS — O99211 Obesity complicating pregnancy, first trimester: Secondary | ICD-10-CM

## 2021-04-25 DIAGNOSIS — Z3A11 11 weeks gestation of pregnancy: Secondary | ICD-10-CM

## 2021-04-25 DIAGNOSIS — Z3481 Encounter for supervision of other normal pregnancy, first trimester: Secondary | ICD-10-CM

## 2021-04-25 MED ORDER — ASPIRIN EC 81 MG PO TBEC: 81.0000 mg | DELAYED_RELEASE_TABLET | Freq: Every day | ORAL | 11 refills | Status: DC

## 2021-04-25 MED ORDER — PREPLUS 27-1 MG PO TABS: 1.0000 | ORAL_TABLET | Freq: Every day | ORAL | 13 refills | Status: DC

## 2021-04-25 NOTE — Progress Notes (Signed)
New OB visit. OB panel OB urine culture Genetic Screen  Needs PNV and Iron.

## 2021-04-25 NOTE — Progress Notes (Signed)
Subjective:   Sarah Koch is a 30 y.o. G2P0010 at [redacted]w[redacted]d by early ultrasound being seen today for her first obstetrical visit.  Her obstetrical history is significant for  miscarriage x 1 in 2022  and has Abdominal pain, epigastric; Varicose vein of leg; and Encounter for supervision of normal pregnancy in first trimester on their problem list.. Patient does intend to breast feed. Pregnancy history fully reviewed.  Patient reports no complaints.  HISTORY: OB History  Gravida Para Term Preterm AB Living  2 0 0 0 1 0  SAB IAB Ectopic Multiple Live Births  1 0 0 0 0    # Outcome Date GA Lbr Len/2nd Weight Sex Delivery Anes PTL Lv  2 Current           1 SAB            No past medical history on file. No past surgical history on file. No family history on file. Social History   Tobacco Use   Smoking status: Never   Smokeless tobacco: Never  Vaping Use   Vaping Use: Never used  Substance Use Topics   Alcohol use: No    Alcohol/week: 0.0 standard drinks   Drug use: No   No Known Allergies Current Outpatient Medications on File Prior to Visit  Medication Sig Dispense Refill   acetaminophen-codeine (TYLENOL #3) 300-30 MG tablet Take 1-2 tablets by mouth every 6 (six) hours as needed for moderate pain. 15 tablet 0   Aluminum Chloride Hexahydrate CRYS Apply nightly to armpits 25 g 0   ferrous sulfate 324 MG TBEC Take 324 mg by mouth.     ibuprofen (ADVIL) 600 MG tablet Take 1 tablet (600 mg total) by mouth every 6 (six) hours as needed for mild pain or moderate pain. 60 tablet 0   No current facility-administered medications on file prior to visit.    Indications for ASA therapy (per uptodate) One of the following: Previous pregnancy with preeclampsia, especially early onset and with an adverse outcome No Multifetal gestation No Chronic hypertension No Type 1 or 2 diabetes mellitus No Chronic kidney disease No Autoimmune disease (antiphospholipid syndrome, systemic lupus  erythematosus) No  Two or more of the following: Nulliparity Yes Obesity (body mass index >30 kg/m2) Yes Family history of preeclampsia in mother or sister No Age ?35 years No Sociodemographic characteristics (African American race, low socioeconomic level) Yes Personal risk factors (eg, previous pregnancy with low birth weight or small for gestational age infant, previous adverse pregnancy outcome [eg, stillbirth], interval >10 years between pregnancies) No  Indications for early 1 hour GTT (per uptodate)  BMI >25 (>23 in Asian women) AND one of the following  Gestational diabetes mellitus in a previous pregnancy No Glycated hemoglobin ?5.7 percent (39 mmol/mol), impaired glucose tolerance, or impaired fasting glucose on previous testing No First-degree relative with diabetes No High-risk race/ethnicity (eg, African American, Latino, Native American, [place] American, Pacific Islander) Yes History of cardiovascular disease No Hypertension or on therapy for hypertension No High-density lipoprotein cholesterol level <35 mg/dL (2.35 mmol/L) and/or a triglyceride level >250 mg/dL (3.61 mmol/L) No Polycystic ovary syndrome No Physical inactivity No Other clinical condition associated with insulin resistance (eg, severe obesity, acanthosis nigricans) No Previous birth of an infant weighing ?4000 g No Previous stillbirth of unknown cause No Exam   Vitals:   04/25/21 1520  BP: 123/78  Pulse: (!) 115  Weight: 179 lb (81.2 kg)      VS reviewed, nursing  note reviewed,  Constitutional: well developed, well nourished, no distress HEENT: normocephalic CV: normal rate Pulm/chest wall: normal effort Breast Exam:  deferred Neuro: alert and oriented x 3 Skin: warm, dry Psych: affect normal Pelvic exam: Cervix pink, visually closed, without lesion, scant white creamy discharge, vaginal walls and external genitalia normal    Assessment:   Pregnancy: G2P0010 Patient Active Problem List    Diagnosis Date Noted   Encounter for supervision of normal pregnancy in first trimester 04/14/2021   Abdominal pain, epigastric 01/04/2016   Varicose vein of leg 01/04/2016     Plan:  1. Encounter for supervision of other normal pregnancy in first trimester --Anticipatory guidance about next visits/weeks of pregnancy given. --Next visit in 4 weeks for AFP  - CBC/D/Plt+RPR+Rh+ABO+RubIgG... - Hepatitis C Antibody - Culture, OB Urine - Cervicovaginal ancillary only( [place]) - Cytology - PAP    Initial labs drawn. Continue prenatal vitamins. Discussed and offered genetic screening options, including Quad screen/AFP, NIPS testing, and option to decline testing. Benefits/risks/alternatives reviewed. Pt aware that anatomy US is form of genetic screening with lower accuracy in detecting trisomies than blood work.  Pt chooses genetic screening today. NIPS: ordered. Ultrasound discussed; fetal anatomic survey: requested. Problem list reviewed and updated. The nature of [facility] with multiple MDs and other Advanced Practice Providers was explained to patient; also emphasized that residents, students are part of our team. Routine obstetric precautions reviewed. No follow-ups on file.   Sharen Counter, CNM 04/25/21 5:15 PM

## 2021-04-27 LAB — CERVICOVAGINAL ANCILLARY ONLY
Chlamydia: NEGATIVE
Comment: NEGATIVE
Comment: NORMAL
Neisseria Gonorrhea: NEGATIVE

## 2021-04-28 ENCOUNTER — Other Ambulatory Visit: Payer: Self-pay

## 2021-04-28 ENCOUNTER — Encounter: Payer: Self-pay | Admitting: Obstetrics

## 2021-04-28 LAB — CBC/D/PLT+RPR+RH+ABO+RUBIGG...
Antibody Screen: NEGATIVE
Basophils Absolute: 0 10*3/uL (ref 0.0–0.2)
Basos: 0 %
EOS (ABSOLUTE): 0 10*3/uL (ref 0.0–0.4)
Eos: 1 %
HCV Ab: <0.1 s/co ratio (ref 0.0–0.9)
HIV Screen 4th Generation wRfx: NONREACTIVE
Hematocrit: 38.1 % (ref 34.0–46.6)
Hemoglobin: 12.6 g/dL (ref 11.1–15.9)
Hepatitis B Surface Ag: NEGATIVE
Immature Grans (Abs): 0 10*3/uL (ref 0.0–0.1)
Immature Granulocytes: 0 %
Lymphocytes Absolute: 1.5 10*3/uL (ref 0.7–3.1)
Lymphs: 31 %
MCH: 26 pg — ABNORMAL LOW (ref 26.6–33.0)
MCHC: 33.1 g/dL (ref 31.5–35.7)
MCV: 79 fL (ref 79–97)
Monocytes Absolute: 0.4 10*3/uL (ref 0.1–0.9)
Monocytes: 8 %
Neutrophils Absolute: 2.8 10*3/uL (ref 1.4–7.0)
Neutrophils: 60 %
Platelets: 199 10*3/uL (ref 150–450)
RBC: 4.84 x10E6/uL (ref 3.77–5.28)
RDW: 14.5 % (ref 11.7–15.4)
RPR Ser Ql: NONREACTIVE
Rh Factor: POSITIVE
Rubella Antibodies, IGG: 5.74 index (ref >0.99)
WBC: 4.7 10*3/uL (ref 3.4–10.8)

## 2021-04-28 LAB — HEMOGLOBIN A1C
Est. average glucose Bld gHb Est-mCnc: 114 mg/dL
Hgb A1c MFr Bld: 5.6 % (ref 4.8–5.6)

## 2021-04-28 LAB — HCV INTERPRETATION

## 2021-04-28 LAB — CULTURE, OB URINE

## 2021-04-28 LAB — URINE CULTURE, OB REFLEX

## 2021-04-28 MED ORDER — PREPLUS 27-1 MG PO TABS: 1.0000 | ORAL_TABLET | Freq: Every day | ORAL | 13 refills | Status: DC

## 2021-04-29 ENCOUNTER — Other Ambulatory Visit: Payer: Self-pay

## 2021-04-29 DIAGNOSIS — Z3481 Encounter for supervision of other normal pregnancy, first trimester: Secondary | ICD-10-CM

## 2021-04-29 MED ORDER — TRINATE PO TABS: 1.0000 | ORAL_TABLET | Freq: Every day | ORAL | 13 refills | Status: DC

## 2021-04-29 NOTE — Progress Notes (Signed)
Insurance did not cover original Rx sent in. Request change. Rx changed per insurance request.

## 2021-05-04 ENCOUNTER — Encounter: Payer: Self-pay | Admitting: Advanced Practice Midwife

## 2021-05-05 ENCOUNTER — Encounter: Payer: Self-pay | Admitting: Advanced Practice Midwife

## 2021-05-09 ENCOUNTER — Encounter (HOSPITAL_COMMUNITY): Payer: Self-pay | Admitting: Obstetrics and Gynecology

## 2021-05-09 ENCOUNTER — Other Ambulatory Visit: Payer: Self-pay

## 2021-05-09 ENCOUNTER — Inpatient Hospital Stay (HOSPITAL_COMMUNITY)
Admission: AD | Admit: 2021-05-09 | Discharge: 2021-05-10 | Disposition: A | Discharge status: Home / Self Care | Payer: BC Managed Care – PPO | Attending: Obstetrics and Gynecology | Admitting: Obstetrics and Gynecology

## 2021-05-09 DIAGNOSIS — Z791 Long term (current) use of non-steroidal anti-inflammatories (NSAID): Secondary | ICD-10-CM | POA: Insufficient documentation

## 2021-05-09 DIAGNOSIS — O99612 Diseases of the digestive system complicating pregnancy, second trimester: Secondary | ICD-10-CM | POA: Insufficient documentation

## 2021-05-09 DIAGNOSIS — K5900 Constipation, unspecified: Secondary | ICD-10-CM

## 2021-05-09 DIAGNOSIS — O98812 Other maternal infectious and parasitic diseases complicating pregnancy, second trimester: Secondary | ICD-10-CM | POA: Insufficient documentation

## 2021-05-09 DIAGNOSIS — Z7982 Long term (current) use of aspirin: Secondary | ICD-10-CM | POA: Insufficient documentation

## 2021-05-09 DIAGNOSIS — B3731 Acute candidiasis of vulva and vagina: Secondary | ICD-10-CM

## 2021-05-09 DIAGNOSIS — Z3A14 14 weeks gestation of pregnancy: Secondary | ICD-10-CM

## 2021-05-09 DIAGNOSIS — B373 Candidiasis of vulva and vagina: Secondary | ICD-10-CM | POA: Insufficient documentation

## 2021-05-09 HISTORY — DX: Other specified health status: Z78.9

## 2021-05-09 NOTE — MAU Note (Signed)
Pt reports at 3 pm she noted a little bleeding and is worried. Some sharp pain in her lower abd. Not bleeding at this time.

## 2021-05-10 ENCOUNTER — Telehealth: Payer: Self-pay

## 2021-05-10 DIAGNOSIS — B373 Candidiasis of vulva and vagina: Secondary | ICD-10-CM | POA: Diagnosis not present

## 2021-05-10 DIAGNOSIS — Z3A14 14 weeks gestation of pregnancy: Secondary | ICD-10-CM | POA: Diagnosis not present

## 2021-05-10 DIAGNOSIS — O209 Hemorrhage in early pregnancy, unspecified: Secondary | ICD-10-CM | POA: Diagnosis present

## 2021-05-10 DIAGNOSIS — O98812 Other maternal infectious and parasitic diseases complicating pregnancy, second trimester: Secondary | ICD-10-CM

## 2021-05-10 DIAGNOSIS — Z791 Long term (current) use of non-steroidal anti-inflammatories (NSAID): Secondary | ICD-10-CM | POA: Diagnosis not present

## 2021-05-10 DIAGNOSIS — K5900 Constipation, unspecified: Secondary | ICD-10-CM | POA: Diagnosis not present

## 2021-05-10 DIAGNOSIS — O99612 Diseases of the digestive system complicating pregnancy, second trimester: Secondary | ICD-10-CM | POA: Diagnosis not present

## 2021-05-10 DIAGNOSIS — Z7982 Long term (current) use of aspirin: Secondary | ICD-10-CM | POA: Diagnosis not present

## 2021-05-10 LAB — URINALYSIS, ROUTINE W REFLEX MICROSCOPIC
Bilirubin Urine: NEGATIVE
Glucose, UA: NEGATIVE mg/dL
Ketones, ur: NEGATIVE mg/dL
Leukocytes,Ua: NEGATIVE
Nitrite: NEGATIVE
Protein, ur: NEGATIVE mg/dL
Specific Gravity, Urine: 1.001 — ABNORMAL LOW (ref 1.005–1.030)
pH: 7 (ref 5.0–8.0)

## 2021-05-10 LAB — WET PREP, GENITAL
Clue Cells Wet Prep HPF POC: NONE SEEN
Sperm: NONE SEEN
Trich, Wet Prep: NONE SEEN

## 2021-05-10 MED ORDER — TERCONAZOLE 0.4 % VA CREA: 1.0000 | TOPICAL_CREAM | Freq: Every day | VAGINAL | 0 refills | Status: DC

## 2021-05-10 MED ORDER — DOCUSATE SODIUM 100 MG PO CAPS: 100.0000 mg | ORAL_CAPSULE | Freq: Two times a day (BID) | ORAL | 0 refills | Status: DC

## 2021-05-10 NOTE — MAU Provider Note (Signed)
History     CSN: [id number]  Arrival date and time: 05/09/21 2321   Event Date/Time   First Provider Initiated Contact with Patient 05/10/21 0013      Chief Complaint  Patient presents with   Abdominal Pain   Vaginal Bleeding   HPI Sarah Koch is a 30 y.o. G2P0010 at [redacted]w[redacted]d who presents with vaginal bleeding.  Reports intermittent episodes of white vaginal discharge that contains red bloody streaks.  For the last few days has had vaginal itching and irritation.  Has not bleeding into a pad or passing blood clots.  Denies abdominal pain, dysuria, recent intercourse.  OB History     Gravida  2   Para      Term      Preterm      AB  1   Living         SAB  1   IAB      Ectopic      Multiple      Live Births              Past Medical History:  Diagnosis Date   Medical history non-contributory     Past Surgical History:  Procedure Laterality Date   NO PAST SURGERIES      History reviewed. No pertinent family history.  Social History   Tobacco Use   Smoking status: Never   Smokeless tobacco: Never  Vaping Use   Vaping Use: Never used  Substance Use Topics   Alcohol use: No    Alcohol/week: 0.0 standard drinks   Drug use: No    Allergies: No Known Allergies  Medications Prior to Admission  Medication Sig Dispense Refill Last Dose   aspirin EC 81 MG tablet Take 1 tablet (81 mg total) by mouth daily. Swallow whole. 30 tablet 11 05/09/2021   Prenatal Vit-Fe Fumarate-FA (TRINATE) TABS Take 1 tablet by mouth daily. 30 tablet 13 05/09/2021   acetaminophen-codeine (TYLENOL #3) 300-30 MG tablet Take 1-2 tablets by mouth every 6 (six) hours as needed for moderate pain. 15 tablet 0    Aluminum Chloride Hexahydrate CRYS Apply nightly to armpits 25 g 0    ferrous sulfate 324 MG TBEC Take 324 mg by mouth.      ibuprofen (ADVIL) 600 MG tablet Take 1 tablet (600 mg total) by mouth every 6 (six) hours as needed for mild pain or moderate pain. 60 tablet 0      Review of Systems  Constitutional: Negative.   Gastrointestinal: Negative.   Genitourinary:  Positive for vaginal bleeding and vaginal discharge. Negative for dysuria.  Physical Exam   Blood pressure 113/71, pulse 93, temperature 98.4 F (36.9 C), temperature source Oral, resp. rate 16, last menstrual period 02/09/2021, SpO2 100 %, unknown if currently breastfeeding.  Physical Exam Vitals and nursing note reviewed. Exam conducted with a chaperone present.  Constitutional:      Appearance: She is well-developed.  HENT:     Head: Normocephalic and atraumatic.  Eyes:     General: No scleral icterus. Pulmonary:     Effort: Pulmonary effort is normal. No respiratory distress.  Abdominal:     General: Abdomen is flat. There is no distension.     Tenderness: There is no abdominal tenderness.  Genitourinary:    Vagina: Erythema present. No bleeding.     Cervix: Discharge and erythema present. No friability or cervical bleeding.     Comments: Bilateral labia excoriated. Thick white discharge No blood Cervix closed Skin:  General: Skin is warm and dry.  Neurological:     Mental Status: She is alert.  Psychiatric:        Mood and Affect: Mood normal.        Behavior: Behavior normal.    MAU Course  Procedures Results for orders placed or performed during the hospital encounter of 05/09/21 (from the past 24 hour(s))  Urinalysis, Routine w reflex microscopic Urine, Clean Catch     Status: Abnormal   Collection Time: 05/10/21 12:32 AM  Result Value Ref Range   Color, Urine COLORLESS (A) YELLOW   APPearance CLEAR CLEAR   Specific Gravity, Urine 1.001 (L) 1.005 - 1.030   pH 7.0 5.0 - 8.0   Glucose, UA NEGATIVE NEGATIVE mg/dL   Hgb urine dipstick SMALL (A) NEGATIVE   Bilirubin Urine NEGATIVE NEGATIVE   Ketones, ur NEGATIVE NEGATIVE mg/dL   Protein, ur NEGATIVE NEGATIVE mg/dL   Nitrite NEGATIVE NEGATIVE   Leukocytes,Ua NEGATIVE NEGATIVE   WBC, UA 0-5 0 - 5 WBC/hpf    Bacteria, UA RARE (A) NONE SEEN   Squamous Epithelial / LPF 0-5 0 - 5  Wet prep, genital     Status: Abnormal   Collection Time: 05/10/21 12:32 AM   Specimen: Vaginal  Result Value Ref Range   Yeast Wet Prep HPF POC PRESENT (A) NONE SEEN   Trich, Wet Prep NONE SEEN NONE SEEN   Clue Cells Wet Prep HPF POC NONE SEEN NONE SEEN   WBC, Wet Prep HPF POC MANY (A) NONE SEEN   Sperm NONE SEEN     MDM FHT present via doppler Patient presents with complaint of vaginal discharge that contains blood streaks.  Exam consistent with vaginal yeast infection.  Cervix is closed.  She is Rh+.  We will treat with Terazol.  *[place] phone interpreter used for this encounter* Assessment and Plan   1. Vaginal yeast infection   2. [redacted] weeks gestation of pregnancy   3. Constipation during pregnancy in second trimester    -Rx terazol -reviewed reasons to return to MAU   Judeth Horn, NP   Judeth Horn 05/10/2021, 12:13 AM

## 2021-05-10 NOTE — Telephone Encounter (Signed)
Late Entry from 05/09/21 Early OB pt Pt was on line already with front desk. I was made aware and assisted with call while up front  C/o light spotting , no pain , no clots , no heavy bleeding at all noted a little back pain  Pt advised to go to MAU for assurance pt stated she would go Tuesday 08//23/22 Pt made aware if any increase in bleeding, pain she should report to MAU asap pt agreeable and voiced understanding.

## 2021-05-24 ENCOUNTER — Ambulatory Visit (INDEPENDENT_AMBULATORY_CARE_PROVIDER_SITE_OTHER): Payer: BC Managed Care – PPO | Admitting: Women's Health

## 2021-05-24 ENCOUNTER — Other Ambulatory Visit: Payer: Self-pay

## 2021-05-24 ENCOUNTER — Encounter: Payer: Self-pay | Admitting: Obstetrics and Gynecology

## 2021-05-24 VITALS — BP 116/83 | HR 115 | Wt 185.4 lb

## 2021-05-24 DIAGNOSIS — Z3481 Encounter for supervision of other normal pregnancy, first trimester: Secondary | ICD-10-CM

## 2021-05-24 DIAGNOSIS — Z3A16 16 weeks gestation of pregnancy: Secondary | ICD-10-CM

## 2021-05-24 DIAGNOSIS — O99210 Obesity complicating pregnancy, unspecified trimester: Secondary | ICD-10-CM | POA: Insufficient documentation

## 2021-05-24 NOTE — Progress Notes (Signed)
Subjective:  Sarah Koch is a 30 y.o. G2P0010 at [redacted]w[redacted]d being seen today for ongoing prenatal care.  She is currently monitored for the following issues for this low-risk pregnancy and has Varicose vein of leg; Encounter for supervision of normal pregnancy in first trimester; and Obesity in pregnancy on their problem list.  Patient reports no complaints.  Contractions: Not present. Vag. Bleeding: None.  Movement: Present. Denies leaking of fluid.   The following portions of the patient's history were reviewed and updated as appropriate: allergies, current medications, past family history, past medical history, past social history, past surgical history and problem list. Problem list updated.  Objective:   Vitals:   05/24/21 1103  BP: 116/83  Pulse: (!) 115  Weight: 185 lb 6.4 oz (84.1 kg)    Fetal Status: Fetal Heart Rate (bpm): 152   Movement: Present     General:  Alert, oriented and cooperative. Patient is in no acute distress.  Skin: Skin is warm and dry. No rash noted.   Cardiovascular: Normal heart rate noted  Respiratory: Normal respiratory effort, no problems with respiration noted  Abdomen: Soft, gravid, appropriate for gestational age. Pain/Pressure: Absent     Pelvic: Vag. Bleeding: None     Cervical exam deferred        Extremities: Normal range of motion.  Edema: None  Mental Status: Normal mood and affect. Normal behavior. Normal judgment and thought content.   Urinalysis:      Assessment and Plan:  Pregnancy: G2P0010 at [redacted]w[redacted]d  1. Encounter for supervision of other normal pregnancy in first trimester - peds list given - CBE info given - AFP, Serum, Open Spina Bifida - anatomy scan ordered  PHQ9 SCORE ONLY 04/14/2021  PHQ-9 Total Score 0   GAD 7 : Generalized Anxiety Score 04/14/2021  Nervous, Anxious, on Edge 0  Control/stop worrying 0  Worry too much - different things 0  Trouble relaxing 0  Restless 0  Easily annoyed or irritable 0  Afraid - awful might  happen 0  Total GAD 7 Score 0   2. [redacted] weeks gestation of pregnancy  Preterm labor symptoms and general obstetric precautions including but not limited to vaginal bleeding, contractions, leaking of fluid and fetal movement were reviewed in detail with the patient. I discussed the assessment and treatment plan with the patient. The patient was provided an opportunity to ask questions and all were answered. The patient agreed with the plan and demonstrated an understanding of the instructions. The patient was advised to call back or seek an in-person office evaluation/go to [facility] at [facility] for any urgent or concerning symptoms. Please refer to After Visit Summary for other counseling recommendations.  Return in about 4 weeks (around 06/21/2021) for in-person LOB/APP OK, needs anatomy scan scheduled.   Madelena Maturin, Odie Sera, NP

## 2021-05-24 NOTE — Patient Instructions (Addendum)
Maternity Assessment Unit (MAU)  The Maternity Assessment Unit (MAU) is located at the [facility] at [facility]. The address is: [address], [address], [place], [place] [address]. Please see map below for additional directions.    The Maternity Assessment Unit is designed to help you during your pregnancy, and for up to 6 weeks after delivery, with any pregnancy- or postpartum-related emergencies, if you think you are in labor, or if your water has broken. For example, if you experience nausea and vomiting, vaginal bleeding, severe abdominal or pelvic pain, elevated blood pressure or other problems related to your pregnancy or postpartum time, please come to the Maternity Assessment Unit for assistance.       Preterm Labor The normal length of a pregnancy is 39-41 weeks. Preterm labor is when labor starts before 37 completed weeks of pregnancy. Babies who are born prematurely and survive may not be fully developed and may be at an increased risk for long-term problems such as cerebral palsy, developmental delays, and vision and hearing problems. Babies who are born too early may have problems soon after birth. Premature babies may have problems regulating blood sugar, body temperature, heart rate, and breathing rate. These babies often have trouble with feeding. The risk of having problems is highest for babies who are born before 34 weeks of pregnancy. What are the causes? The exact cause of this condition is not known. What increases the risk? You are more likely to have preterm labor if you have certain risk factors that relate to your medical history, problems with present and past pregnancies, and lifestyle factors. Medical history You have abnormalities of the uterus, including a short cervix. You have STIs (sexually transmitted infections) or other infections of the urinary tract and the vagina. You have chronic illnesses, such as blood clotting  problems, diabetes, or high blood pressure. You are overweight or underweight. Present and past pregnancies You have had preterm labor before. You are pregnant with twins or other multiples. You have been diagnosed with a condition in which the placenta covers your cervix (placenta previa). You waited less than 18 months between giving birth and becoming pregnant again. Your unborn baby has some abnormalities. You have vaginal bleeding during pregnancy. You became pregnant through in vitro fertilization (IVF). Lifestyle and environmental factors You use tobacco products or drink alcohol. You use drugs. You have stress and no social support. You experience domestic violence. You are exposed to certain chemicals or environmental pollutants. Other factors You are younger than age 17 or older than age 35. What are the signs or symptoms? Symptoms of this condition include: Cramps similar to those that can happen during a menstrual period. The cramps may happen with diarrhea. Pain in the abdomen or lower back. Regular contractions that may feel like tightening of the abdomen. A feeling of increased pressure in the pelvis. Increased watery or bloody mucus discharge from the vagina. Water breaking (ruptured amniotic sac). How is this diagnosed? This condition is diagnosed based on: Your medical history and a physical exam. A pelvic exam. An ultrasound. Monitoring your uterus for contractions. Other tests, including: A swab of the cervix to check for a chemical called fetal fibronectin. Urine tests. How is this treated? Treatment for this condition depends on the length of your pregnancy, your condition, and the health of your baby. Treatment may include: Taking medicines, such as: Hormone medicines. These may be given early in pregnancy to help support the pregnancy. Medicines to stop   contractions. Medicines to help mature the baby's lungs. These may be prescribed if the risk of  delivery is high. Medicines to help protect your baby from brain and nerve complications such as cerebral palsy. Bed rest. If the labor happens before 34 weeks of pregnancy, you may need to stay in the hospital. Delivery of the baby. Follow these instructions at home:  Do not use any products that contain nicotine or tobacco. These products include cigarettes, chewing tobacco, and vaping devices, such as e-cigarettes. If you need help quitting, ask your health care provider. Do not drink alcohol. Take over-the-counter and prescription medicines only as told by your health care provider. Rest as told by your health care provider. Return to your normal activities as told by your health care provider. Ask your health care provider what activities are safe for you. Keep all follow-up visits. This is important. How is this prevented? To increase your chance of having a full-term pregnancy: Do not use drugs or take medicines that have not been prescribed to you during your pregnancy. Talk with your health care provider before taking any herbal supplements, even if you have been taking them regularly. Make sure you gain a healthy amount of weight during your pregnancy. Watch for infection. If you think that you might have an infection, get it checked right away. Symptoms of infection may include: Fever. Abnormal vaginal discharge or discharge that smells bad. Pain or burning with urination. Needing to urinate urgently. Frequently urinating or passing small amounts of urine frequently. Blood in your urine or urine that smells bad or unusual. Where to find more information U.S. Department of Health and Cytogeneticist on Women's Health: http://hoffman.com/ [employer] of Obstetricians and Gynecologists: www.acog.org Centers for Disease Control and Prevention, Preterm Birth: FootballExhibition.com.br Contact a health care provider if: You think you are going into preterm labor. You have signs  or symptoms of preterm labor. You have symptoms of infection. Get help right away if: You are having regular, painful contractions every 5 minutes or less. Your water breaks. Summary Preterm labor is labor that starts before you reach 37 weeks of pregnancy. Delivering your baby early increases your baby's risk of developing long-term problems. You are more likely to have preterm labor if you have certain risk factors that relate to your medical history, problems with present and past pregnancies, and lifestyle factors. Keep all follow-up visits. This is important. Contact a health care provider if you have signs or symptoms of preterm labor. This information is not intended to replace advice given to you by your health care provider. Make sure you discuss any questions you have with your health care provider. Document Revised: 09/07/2020 Document Reviewed: 09/07/2020 Elsevier Patient Education  2022 Elsevier Inc.       Alpha-Fetoprotein Test Why am I having this test? The alpha-fetoprotein test is a lab test most commonly used for pregnant women to help screen for birth defects in their unborn baby. It can be used to screen for chromosome (DNA) abnormalities, problems with the brain or spinal cord, or problems with the abdominal wall of the unborn baby (fetus). The alpha-fetoprotein test may also be done for men or nonpregnant women to check for certain cancers. What is being tested? This test measures the amount of alpha-fetoprotein (AFP) in your blood. AFP is a protein that is made by the liver. Levels can be detected in the mother's blood during pregnancy, starting at 10 weeks and peaking at 16-18 weeks of the pregnancy.  Abnormal levels can sometimes be a sign of a birth defect in the baby. Certain cancers can cause a high level of AFP in men and nonpregnant women. What kind of sample is taken? A blood sample is required for this test. It is usually collected by inserting a needle  into a blood vessel. How are the results reported? Your test results will be reported as values. Your health care provider will compare your results to normal ranges that were established after testing a large group of people (reference values). Reference values may vary among labs and hospitals. For this test, common reference values are: Adult: Less than 40 ng/mL or less than 40 mcg/L (SI units). Child younger than 1 year: Less than 30 ng/mL. If you are pregnant, the values may also vary based on how long you have been pregnant. What do the results mean? Results that are above the reference values in pregnant women may indicate the following for the baby: Neural tube defects, such as abnormalities of the spinal cord or brain. Abdominal wall defects. Multiple pregnancy such as twins. Fetal distress or fetal death. Results that are above the reference values in men or nonpregnant women may indicate: Reproductive cancers, such as ovarian or testicular cancer. Liver cancer. Liver cell death. Other types of cancer. Very low levels of AFP in pregnant women may indicate Down syndrome for the baby. Talk with your health care provider about what your results mean. Questions to ask your health care provider Ask your health care provider, or the department that is doing the test: When will my results be ready? How will I get my results? What are my treatment options? What other tests do I need? What are my next steps? Summary The alpha-fetoprotein test is done on pregnant women to help screen for birth defects in their unborn baby. Certain cancers can cause a high level of AFP in men and nonpregnant women. For this test, a blood sample is usually collected by inserting a needle into a blood vessel. Talk with your health care provider about what your results mean. This information is not intended to replace advice given to you by your health care provider. Make sure you discuss any questions you  have with your health care provider. Document Revised: 03/26/2020 Document Reviewed: 03/26/2020 Elsevier Patient Education  2022 Elsevier Inc.       AREA PEDIATRIC/FAMILY PRACTICE PHYSICIANS  [facility] [address] [place], [place] [address] Phone - [phone]   Fax - [phone]  JACK AMOS [address] [place], [place]  [address] Phone - [phone]   Fax - [phone]  [facility] [address] [place], [place]  [address] Phone - [phone]   Fax - [phone]  [facility] [address] [place], [place]  [address] Phone - [phone]   Fax - [phone]  [facility] [address] [place], [place]  [address] Phone - [phone]   Fax - [phone]  [facility] [address] [place], [place]  [address] Phone - [phone]   Fax - [phone]  [facility] [place] [address], [address] [place], [place]  [address] Phone - [phone]   Fax - [phone]  [facility] AT [facility] [address] [place], [address] [place], [place]  [address] Phone - [phone]   Fax - [phone]  [facility] AT [facility] [address] [place], [place]  [address]  Phone - [phone]   Fax - [phone] [facility] [address] [place], [place]  [address] Phone - [phone]   Fax - [phone]  [facility] [address] [place], [place]  [address] Phone - [phone]   Fax - [phone]  [facility] [address] [place], [place]  [address] Phone - [phone]   Fax - [phone]  [facility] [place] [address] [place], [place]  [address] Phone - [phone]   Fax - [phone]  [facility] [address], [address] [place], [place]  [address] Phone -  [phone]  [facility] [address] [place], [place]  [address] Phone - [phone]   Fax - [phone]  [facility] [address] [place], [place]  [address] Phone - [phone]   Fax - [phone]  [facility] [address] [place], [place]  [address] Phone - [phone]   Fax - [phone]  [facility] [address] [place], [place]  [address] Phone - [phone]   Fax - [phone]   [facility] [address] [place], [place]  [address] Phone - [phone]   Fax - [phone]  [facility] [address] [place], [place]  [address] Phone - [phone]   Fax - [phone]  DAVID RUBIN [address] [place], [place]  [address] Phone - [phone]   Fax - [phone]  [facility] [address] [place], [place]  [address] Phone - [phone]   Fax - [phone]  [facility] [name] [address] [place], [place]  [address] Phone - [phone]   Fax - [phone] Gerarda Fraction [address] [place], [place]  [address] Phone - [phone]   Fax - [phone]  [facility] [address] [place], [place]  [address] Phone - [phone]   Fax - [phone]  [facility] [place] [address] [place], [place]  [address] Phone - [phone]   Fax - [phone]       Childbirth Education Options: [facility] Department Classes:  Childbirth education classes can help you get ready for a positive parenting experience. You can also meet other expectant parents and get free stuff for your baby. Each class runs for five weeks on the same night and costs $45 for the mother-to-be and her support person. Medicaid covers the cost if you are eligible. Call [phone] to register. [facility]  Childbirth Education: Classes can vary in availability and schedule is subject to change. For most up-to-date information please visit [email or web address] to review and register.             Second Trimester of Pregnancy The second trimester of pregnancy is from week 13 through week 27. This is months 4 through 6 of pregnancy. The second trimester is often a time when you feel your best. Your body has adjusted to being pregnant, and you begin to feel better physically. During the second trimester: Morning sickness has lessened or stopped completely. You may have more energy. You may have an increase in appetite. The second trimester is also a time when the unborn baby (fetus) is growing rapidly. At the end of the sixth month, the fetus may be up to 12 inches long and weigh about 1 pounds. You will likely begin to  feel the baby move (quickening) between 16 and 20 weeks of pregnancy. Body changes during your second trimester Your body continues to go through many changes during your second trimester. The changes vary and generally return to normal after the baby is born. Physical changes Your weight will continue to increase. You will notice your lower abdomen bulging out. You may begin to get stretch marks on your hips, abdomen, and breasts. Your breasts will continue to grow and to become tender. Dark spots or blotches (chloasma or mask of pregnancy) may develop on your face. A dark line from your belly button to the pubic area (linea nigra) may appear. You may have changes in your hair. These can include thickening of your hair, rapid growth, and changes in texture. Some people also have hair loss during or after pregnancy, or hair that feels dry or thin. Health changes You may develop headaches. You may have heartburn. You may develop constipation. You may develop hemorrhoids or swollen, bulging veins (varicose veins). Your gums may bleed and may be sensitive to brushing and  flossing. You may urinate more often because the fetus is pressing on your bladder. You may have back pain. This is caused by: Weight gain. Pregnancy hormones that are relaxing the joints in your pelvis. A shift in weight and the muscles that support your balance. Follow these instructions at home: Medicines Follow your health care provider's instructions regarding medicine use. Specific medicines may be either safe or unsafe to take during pregnancy. Do not take any medicines unless approved by your health care provider. Take a prenatal vitamin that contains at least 600 micrograms (mcg) of folic acid. Eating and drinking Eat a healthy diet that includes fresh fruits and vegetables, whole grains, good sources of protein such as meat, eggs, or tofu, and low-fat dairy products. Avoid raw meat and unpasteurized juice, milk, and cheese. These carry germs that can harm you and your baby. You may need to take these actions to prevent or treat constipation: Drink enough fluid to keep your urine pale yellow. Eat foods that are high in fiber, such as beans, whole grains, and fresh fruits and vegetables. Limit foods that are high in fat and processed sugars, such as fried or sweet foods. Activity Exercise only as directed by your health care provider. Most people can continue their usual exercise routine during pregnancy. Try to exercise for 30 minutes at least 5 days a week. Stop exercising if you develop contractions in your uterus. Stop exercising if you develop pain or cramping in the lower abdomen or lower back. Avoid exercising if it is very hot or humid or if you are at a high altitude. Avoid heavy lifting. If you choose to, you may have sex unless your health care provider tells you not to. Relieving pain and discomfort Wear a supportive bra to prevent discomfort from breast tenderness. Take warm sitz baths to soothe any pain or discomfort caused by hemorrhoids. Use hemorrhoid cream if your  health care provider approves. Rest with your legs raised (elevated) if you have leg cramps or low back pain. If you develop varicose veins: Wear support hose as told by your health care provider. Elevate your feet for 15 minutes, 3-4 times a day. Limit salt in your diet. Safety Wear your seat belt at all times when driving or riding in a car. Talk with your health care provider if someone is verbally or physically abusive to you. Lifestyle Do not use hot tubs, steam rooms, or saunas.  Do not douche. Do not use tampons or scented sanitary pads. Avoid cat litter boxes and soil used by cats. These carry germs that can cause birth defects in the baby and possibly loss of the fetus by miscarriage or stillbirth. Do not use herbal remedies, alcohol, illegal drugs, or medicines that are not approved by your health care provider. Chemicals in these products can harm your baby. Do not use any products that contain nicotine or tobacco, such as cigarettes, e-cigarettes, and chewing tobacco. If you need help quitting, ask your health care provider. General instructions During a routine prenatal visit, your health care provider will do a physical exam and other tests. He or she will also discuss your overall health. Keep all follow-up visits. This is important. Ask your health care provider for a referral to a local prenatal education class. Ask for help if you have counseling or nutritional needs during pregnancy. Your health care provider can offer advice or refer you to specialists for help with various needs. Where to find more information American Pregnancy Association: americanpregnancy.org [employer] of Obstetricians and Gynecologists: https://www.todd-brady.net/acog.org/en/Womens%20Health/Pregnancy Office on [employer] Corporation[employer]'s Health: MightyReward.co.nzwomenshealth.gov/pregnancy Contact a health care provider if you have: A headache that does not go away when you take medicine. Vision changes or you see spots in front of your eyes. Mild pelvic  cramps, pelvic pressure, or nagging pain in the abdominal area. Persistent nausea, vomiting, or diarrhea. A bad-smelling vaginal discharge or foul-smelling urine. Pain when you urinate. Sudden or extreme swelling of your face, hands, ankles, feet, or legs. A fever. Get help right away if you: Have fluid leaking from your vagina. Have spotting or bleeding from your vagina. Have severe abdominal cramping or pain. Have difficulty breathing. Have chest pain. Have fainting spells. Have not felt your baby move for the time period told by your health care provider. Have new or increased pain, swelling, or redness in an arm or leg. Summary The second trimester of pregnancy is from week 13 through week 27 (months 4 through 6). Do not use herbal remedies, alcohol, illegal drugs, or medicines that are not approved by your health care provider. Chemicals in these products can harm your baby. Exercise only as directed by your health care provider. Most people can continue their usual exercise routine during pregnancy. Keep all follow-up visits. This is important. This information is not intended to replace advice given to you by your health care provider. Make sure you discuss any questions you have with your health care provider. Document Revised: 02/11/2020 Document Reviewed: 12/18/2019 Elsevier Patient Education  2022 [employer]

## 2021-05-26 LAB — AFP, SERUM, OPEN SPINA BIFIDA
AFP MoM: 1.15
AFP Value: 38.9 ng/mL
Gest. Age on Collection Date: 16 weeks
Maternal Age At EDD: 28.2 yr
OSBR Risk 1 IN: 10000
Test Results:: NEGATIVE
Weight: 185 [lb_av]

## 2021-05-26 LAB — COMPREHENSIVE METABOLIC PANEL
ALT: 10 IU/L (ref 0–32)
AST: 11 IU/L (ref 0–40)
Albumin/Globulin Ratio: 1.4 (ref 1.2–2.2)
Albumin: 3.6 g/dL — ABNORMAL LOW (ref 3.9–5.0)
Alkaline Phosphatase: 48 IU/L (ref 44–121)
BUN/Creatinine Ratio: 10 (ref 9–23)
BUN: 4 mg/dL — ABNORMAL LOW (ref 6–20)
Bilirubin Total: <0.2 mg/dL (ref 0.0–1.2)
CO2: 21 mmol/L (ref 20–29)
Calcium: 8.5 mg/dL — ABNORMAL LOW (ref 8.7–10.2)
Chloride: 103 mmol/L (ref 96–106)
Creatinine, Ser: 0.4 mg/dL — ABNORMAL LOW (ref 0.57–1.00)
Globulin, Total: 2.6 g/dL (ref 1.5–4.5)
Glucose: 95 mg/dL (ref 65–99)
Potassium: 3.9 mmol/L (ref 3.5–5.2)
Sodium: 137 mmol/L (ref 134–144)
Total Protein: 6.2 g/dL (ref 6.0–8.5)
eGFR: 139 mL/min/{1.73_m2} (ref >59)

## 2021-05-26 LAB — PROTEIN / CREATININE RATIO, URINE
Creatinine, Urine: 36.7 mg/dL
Protein, Ur: 9.4 mg/dL
Protein/Creat Ratio: 256 mg/g creat — ABNORMAL HIGH (ref 0–200)

## 2021-06-14 ENCOUNTER — Ambulatory Visit: Payer: BC Managed Care – PPO | Attending: Women's Health

## 2021-06-14 ENCOUNTER — Ambulatory Visit: Payer: BC Managed Care – PPO

## 2021-06-21 ENCOUNTER — Ambulatory Visit (INDEPENDENT_AMBULATORY_CARE_PROVIDER_SITE_OTHER): Payer: BC Managed Care – PPO | Admitting: Family Medicine

## 2021-06-21 ENCOUNTER — Other Ambulatory Visit: Payer: Self-pay

## 2021-06-21 ENCOUNTER — Encounter: Payer: Self-pay | Admitting: Obstetrics

## 2021-06-21 ENCOUNTER — Encounter: Payer: Self-pay | Admitting: Family Medicine

## 2021-06-21 DIAGNOSIS — O99211 Obesity complicating pregnancy, first trimester: Secondary | ICD-10-CM

## 2021-06-21 DIAGNOSIS — R030 Elevated blood-pressure reading, without diagnosis of hypertension: Secondary | ICD-10-CM

## 2021-06-21 DIAGNOSIS — Z3A20 20 weeks gestation of pregnancy: Secondary | ICD-10-CM

## 2021-06-21 DIAGNOSIS — Z3482 Encounter for supervision of other normal pregnancy, second trimester: Secondary | ICD-10-CM

## 2021-06-21 MED ORDER — BLOOD PRESSURE KIT DEVI: 0 refills | Status: DC

## 2021-06-21 MED ORDER — ASPIRIN EC 81 MG PO TBEC: 81.0000 mg | DELAYED_RELEASE_TABLET | Freq: Every day | ORAL | 5 refills | Status: DC

## 2021-06-21 MED ORDER — TRINATE PO TABS: 1.0000 | ORAL_TABLET | Freq: Every day | ORAL | 11 refills | Status: DC

## 2021-06-21 NOTE — Progress Notes (Signed)
Interpreter # [id number] used for visit.

## 2021-06-21 NOTE — Progress Notes (Signed)
   PRENATAL VISIT NOTE  Subjective:  Sarah Koch is a 30 y.o. G2P0010 at [redacted]w[redacted]d being seen today for ongoing prenatal care.  She is currently monitored for the following issues for this low-risk pregnancy and has Varicose vein of leg; Encounter for supervision of normal pregnancy in first trimester; and Obesity in pregnancy on their problem list.  Patient reports feeling tired and would like a couple weeks off of work to rest. She is also wondering if she can have different work accommodations to allow her to work in a position where she can sit down instead of stand. She has run out of her prenatal vitamins and aspirin. She is requesting refills today. Contractions: Not present. Vag. Bleeding: None.  Movement: Present. Denies leaking of fluid.   The following portions of the patient's history were reviewed and updated as appropriate: allergies, current medications, past family history, past medical history, past social history, past surgical history and problem list.   Objective:   Vitals:   06/21/21 1450  BP: 134/87  Pulse: (!) 101  Weight: 193 lb (87.5 kg)    Fetal Status: Fetal Heart Rate (bpm): 150   Movement: Present  General:  Alert, oriented and cooperative. Patient is in no acute distress.  Skin: Skin is warm and dry. No rash noted.   Cardiovascular: Normal heart rate noted.  Respiratory: Normal respiratory effort, no problems with respiration noted.  Abdomen: Soft, gravid, appropriate for gestational age.  Pain/Pressure: Absent.     Pelvic: Cervical exam deferred.        Extremities: Normal range of motion. No LE edema noted.    Mental Status: Normal mood and affect. Normal behavior. Normal judgment and thought content.   Assessment and Plan:  Pregnancy: G2P0010 at [redacted]w[redacted]d  1. Encounter for supervision of other normal pregnancy in second trimester 2. [redacted] weeks gestation of pregnancy Progressing well. FHT within normal limits. Acknowledged patients concerns about needing work  accommodations. Letter provided to patient to allow for breaks and working in a position where she is allowed to sit more often if available. Refilled prenatal vitamins and ASA as below per request. Anatomy US scheduled for 10/6. Will follow up for next prenatal visit in 4 weeks.  - Prenatal Vit-Fe Fumarate-FA (TRINATE) TABS; Take 1 tablet by mouth daily.  Dispense: 30 tablet; Refill: 11  3. Obesity affecting pregnancy in first trimester - aspirin EC 81 MG tablet; Take 1 tablet (81 mg total) by mouth daily. Swallow whole.  Dispense: 30 tablet; Refill: 5  4. Elevated BP without diagnosis of hypertension Borderline BP noted today. Asymptomatic. BP cuff ordered to allow for further monitoring at home. Recommended that patient record her blood pressures for review next visit. Advised to call office if she consistently has blood pressures greater than 140/90. CMP last visit normal. UPC 0.256. If BP elevated at home or at next visit, consider repeating labs at that time.  Preterm labor symptoms and general obstetric precautions including but not limited to vaginal bleeding, contractions, leaking of fluid and fetal movement were reviewed in detail with the patient.  Please refer to After Visit Summary for other counseling recommendations.   Return in about 4 weeks (around 07/19/2021) for follow up LROB visit.  Future Appointments  Date Time Provider Department Center  06/23/2021 10:45 AM [facility] NURSE [facility]  06/23/2021 11:00 AM [facility] US1 [facility]  07/19/2021  1:30 PM Gerrit Heck, CNM [facility] None   Worthy Rancher, MD

## 2021-06-23 ENCOUNTER — Other Ambulatory Visit: Payer: Self-pay

## 2021-06-23 ENCOUNTER — Encounter: Payer: Self-pay | Admitting: *Deleted

## 2021-06-23 ENCOUNTER — Ambulatory Visit (HOSPITAL_BASED_OUTPATIENT_CLINIC_OR_DEPARTMENT_OTHER): Payer: BC Managed Care – PPO

## 2021-06-23 ENCOUNTER — Ambulatory Visit: Payer: BC Managed Care – PPO | Attending: Women's Health | Admitting: *Deleted

## 2021-06-23 VITALS — BP 122/66 | HR 109

## 2021-06-23 DIAGNOSIS — Z3481 Encounter for supervision of other normal pregnancy, first trimester: Secondary | ICD-10-CM

## 2021-06-23 DIAGNOSIS — Z3A20 20 weeks gestation of pregnancy: Secondary | ICD-10-CM | POA: Diagnosis not present

## 2021-06-23 DIAGNOSIS — O99212 Obesity complicating pregnancy, second trimester: Secondary | ICD-10-CM | POA: Diagnosis not present

## 2021-06-23 DIAGNOSIS — O99210 Obesity complicating pregnancy, unspecified trimester: Secondary | ICD-10-CM

## 2021-06-23 DIAGNOSIS — Z363 Encounter for antenatal screening for malformations: Secondary | ICD-10-CM | POA: Diagnosis present

## 2021-06-24 ENCOUNTER — Other Ambulatory Visit: Payer: Self-pay | Admitting: *Deleted

## 2021-06-24 DIAGNOSIS — Z362 Encounter for other antenatal screening follow-up: Secondary | ICD-10-CM

## 2021-06-28 ENCOUNTER — Other Ambulatory Visit: Payer: Self-pay | Admitting: *Deleted

## 2021-06-28 DIAGNOSIS — Z3482 Encounter for supervision of other normal pregnancy, second trimester: Secondary | ICD-10-CM

## 2021-06-28 MED ORDER — PREPLUS 27-1 MG PO TABS
1.0000 | ORAL_TABLET | Freq: Every day | ORAL | 13 refills | Status: DC
Start: 2021-06-28 — End: 2022-08-29

## 2021-06-28 NOTE — Progress Notes (Signed)
Received notice from pharmacy of Trinate on backorder. RX sent for standard PNV.

## 2021-06-29 ENCOUNTER — Telehealth: Payer: Self-pay | Admitting: *Deleted

## 2021-06-29 NOTE — Telephone Encounter (Signed)
TC to patient using Tigrinian interpreter to notify patient of change in PNV due to back order and to notify of new RX available at pharmacy today. No answer. VM not set up.

## 2021-07-04 ENCOUNTER — Encounter: Payer: Self-pay | Admitting: Obstetrics and Gynecology

## 2021-07-07 ENCOUNTER — Encounter: Payer: Self-pay | Admitting: Obstetrics

## 2021-07-19 ENCOUNTER — Other Ambulatory Visit: Payer: Self-pay

## 2021-07-19 ENCOUNTER — Ambulatory Visit (INDEPENDENT_AMBULATORY_CARE_PROVIDER_SITE_OTHER): Payer: BC Managed Care – PPO

## 2021-07-19 ENCOUNTER — Encounter: Payer: Self-pay | Admitting: Obstetrics

## 2021-07-19 VITALS — BP 125/80 | HR 128 | Wt 200.0 lb

## 2021-07-19 DIAGNOSIS — Z789 Other specified health status: Secondary | ICD-10-CM

## 2021-07-19 DIAGNOSIS — Z3A24 24 weeks gestation of pregnancy: Secondary | ICD-10-CM

## 2021-07-19 DIAGNOSIS — O1202 Gestational edema, second trimester: Secondary | ICD-10-CM | POA: Insufficient documentation

## 2021-07-19 DIAGNOSIS — Z3402 Encounter for supervision of normal first pregnancy, second trimester: Secondary | ICD-10-CM

## 2021-07-19 DIAGNOSIS — O99210 Obesity complicating pregnancy, unspecified trimester: Secondary | ICD-10-CM

## 2021-07-19 NOTE — Progress Notes (Signed)
ROB, c/o swelling in hands and feet.

## 2021-07-19 NOTE — Progress Notes (Signed)
LOW-RISK PREGNANCY OFFICE VIST  Patient name: Sarah Koch MRN [id number]  Date of birth: 01-28-1991 Chief Complaint:   Routine Prenatal Visit  Subjective:   Sarah Koch is a 30 y.o. G2P0010 female at [redacted]w[redacted]d with an Estimated Date of Delivery: 11/08/21 being seen today for ongoing management of a low-risk pregnancy aeb has Varicose vein of leg; Encounter for supervision of normal pregnancy in first trimester; Obesity in pregnancy; and Edema during pregnancy in second trimester on their problem list.  Patient presents today with  edema of feet and hands .  She reports that she works 8 hours daily and is on her feet for the majority of the time. Patient endorses fetal movement. Patient denies abdominal cramping or contractions.  Patient denies vaginal concerns including abnormal discharge, leaking of fluid, and bleeding.  Contractions: Not present. Vag. Bleeding: None.  Movement: Present.  Reviewed past medical,surgical, social, obstetrical and family history as well as problem list, medications and allergies.  Objective   Vitals:   07/19/21 1329  BP: 125/80  Pulse: (!) 128  Weight: 200 lb (90.7 kg)  Body mass index is 35.44 kg/m.  Total Weight Gain:21 lb (9.526 kg)         Physical Examination:   General appearance: Well appearing, and in no distress  Mental status: Alert, oriented to person, place, and time  Skin: Warm & dry  Cardiovascular: Normal heart rate noted  Respiratory: Normal respiratory effort, no distress  Abdomen: Soft, gravid, nontender, AGA with Fundal Height: 26 cm  Pelvic: Cervical exam deferred           Extremities: Edema: Mild pitting, slight indentation  Fetal Status: Fetal Heart Rate (bpm): 152  Movement: Present   No results found for this or any previous visit (from the past 24 hour(s)).  Assessment & Plan:  Low-risk pregnancy of a 30 y.o., G2P0010 at [redacted]w[redacted]d with an Estimated Date of Delivery: 11/08/21   1. Encounter for supervision of normal first  pregnancy in second trimester -Anticipatory guidance for upcoming appts. -Patient to schedule next appt in 3-4  weeks for an in-person visit. -Reviewed Glucola appt preparation including fasting the night before and morning of.   *Informed that it is okay to drink plain water throughout the night and prior to consumption of Glucola formula.  -Discussed anticipated office time of 2.5-3 hours.  -Reviewed blood draw procedures and labs which also include check of iron/HgB level, RPR, and HIV *Informed that repeat RPR/HIV are for pediatric records/compliance.  -Discussed how results of GTT are handled including diabetic education and BS testing for abnormal results and routine care for normal results.    2. [redacted] weeks gestation of pregnancy -Doing well overall. -Reviewed concerns.   3. Obesity in pregnancy -TWG 21 lbs -Taking bASA daily.   4. Language barrier affecting health care -Interpretations completed with assistance of Status via phone call: [phone].   5. Edema during pregnancy in second trimester -Reviewed concerns regarding edema. -Pitting edema noted in feet and ankles -Encouraged increase water intake and elevate feet. -Instructed to utilize compression stockings daily especially while at work.  -Shown examples of compression stockings and given information on where to purchase.     Meds: No orders of the defined types were placed in this encounter.  Labs/procedures today:  Lab Orders  No laboratory test(s) ordered today     Reviewed: Preterm labor symptoms and general obstetric precautions including but not limited to vaginal bleeding, contractions, leaking of fluid and fetal movement were  reviewed in detail with the patient.  All questions were answered.  Follow-up: Return in about 3 weeks (around 08/09/2021) for LROB with GTT.  No orders of the defined types were placed in this encounter.  Cherre Robins MSN, CNM 07/19/2021

## 2021-07-21 ENCOUNTER — Other Ambulatory Visit: Payer: Self-pay

## 2021-07-21 ENCOUNTER — Ambulatory Visit: Payer: BC Managed Care – PPO | Attending: Obstetrics and Gynecology

## 2021-07-21 ENCOUNTER — Other Ambulatory Visit: Payer: Self-pay | Admitting: *Deleted

## 2021-07-21 ENCOUNTER — Ambulatory Visit: Payer: BC Managed Care – PPO | Admitting: *Deleted

## 2021-07-21 ENCOUNTER — Encounter: Payer: Self-pay | Admitting: *Deleted

## 2021-07-21 VITALS — BP 118/67 | HR 125

## 2021-07-21 DIAGNOSIS — O99212 Obesity complicating pregnancy, second trimester: Secondary | ICD-10-CM | POA: Diagnosis not present

## 2021-07-21 DIAGNOSIS — Z3A24 24 weeks gestation of pregnancy: Secondary | ICD-10-CM | POA: Diagnosis not present

## 2021-07-21 DIAGNOSIS — Z362 Encounter for other antenatal screening follow-up: Secondary | ICD-10-CM | POA: Diagnosis present

## 2021-07-21 DIAGNOSIS — Z6834 Body mass index (BMI) 34.0-34.9, adult: Secondary | ICD-10-CM

## 2021-07-21 DIAGNOSIS — Z3401 Encounter for supervision of normal first pregnancy, first trimester: Secondary | ICD-10-CM | POA: Diagnosis present

## 2021-07-21 DIAGNOSIS — E669 Obesity, unspecified: Secondary | ICD-10-CM

## 2021-07-21 DIAGNOSIS — O99210 Obesity complicating pregnancy, unspecified trimester: Secondary | ICD-10-CM | POA: Insufficient documentation

## 2021-08-09 ENCOUNTER — Other Ambulatory Visit: Payer: BC Managed Care – PPO

## 2021-08-09 ENCOUNTER — Other Ambulatory Visit: Payer: Self-pay

## 2021-08-09 ENCOUNTER — Ambulatory Visit (INDEPENDENT_AMBULATORY_CARE_PROVIDER_SITE_OTHER): Payer: BC Managed Care – PPO | Admitting: Advanced Practice Midwife

## 2021-08-09 VITALS — BP 117/75 | HR 131 | Wt 203.0 lb

## 2021-08-09 DIAGNOSIS — Z3A27 27 weeks gestation of pregnancy: Secondary | ICD-10-CM

## 2021-08-09 DIAGNOSIS — Z789 Other specified health status: Secondary | ICD-10-CM

## 2021-08-09 DIAGNOSIS — Z3402 Encounter for supervision of normal first pregnancy, second trimester: Secondary | ICD-10-CM

## 2021-08-09 NOTE — Progress Notes (Signed)
   PRENATAL VISIT NOTE  Subjective:  Sarah Koch is a 30 y.o. G2P0010 at [redacted]w[redacted]d being seen today for ongoing prenatal care.  She is currently monitored for the following issues for this low-risk pregnancy and has Varicose vein of leg; Encounter for supervision of normal pregnancy in first trimester; Obesity in pregnancy; and Edema during pregnancy in second trimester on their problem list.  Patient reports no complaints.  Contractions: Not present. Vag. Bleeding: None.  Movement: Present. Denies leaking of fluid.   The following portions of the patient's history were reviewed and updated as appropriate: allergies, current medications, past family history, past medical history, past social history, past surgical history and problem list.   Objective:   Vitals:   08/09/21 0944  BP: 117/75  Pulse: (!) 131  Weight: 203 lb (92.1 kg)    Fetal Status: Fetal Heart Rate (bpm): 156   Movement: Present     General:  Alert, oriented and cooperative. Patient is in no acute distress.  Skin: Skin is warm and dry. No rash noted.   Cardiovascular: Normal heart rate noted  Respiratory: Normal respiratory effort, no problems with respiration noted  Abdomen: Soft, gravid, appropriate for gestational age.  Pain/Pressure: Absent     Pelvic: Cervical exam deferred        Extremities: Normal range of motion.  Edema: Trace  Mental Status: Normal mood and affect. Normal behavior. Normal judgment and thought content.   Assessment and Plan:  Pregnancy: G2P0010 at [redacted]w[redacted]d 1. Encounter for supervision of normal first pregnancy in second trimester --Anticipatory guidance about next visits/weeks of pregnancy given. --Next appt in 2 weeks  - Glucose Tolerance, 2 Hours w/1 Hour - RPR - CBC - HIV antibody (with reflex)  2. [redacted] weeks gestation of pregnancy   3. Language barrier affecting health care --Tigrinian interpreter used on video line for all communication    Preterm labor symptoms and general  obstetric precautions including but not limited to vaginal bleeding, contractions, leaking of fluid and fetal movement were reviewed in detail with the patient. Please refer to After Visit Summary for other counseling recommendations.   No follow-ups on file.  Future Appointments  Date Time Provider Department Center  09/15/2021  9:30 AM [facility] US3 [facility]    Sharen Counter, CNM

## 2021-08-10 LAB — CBC
Hematocrit: 37.3 % (ref 34.0–46.6)
Hemoglobin: 12.8 g/dL (ref 11.1–15.9)
MCH: 27.8 pg (ref 26.6–33.0)
MCHC: 34.3 g/dL (ref 31.5–35.7)
MCV: 81 fL (ref 79–97)
Platelets: 174 10*3/uL (ref 150–450)
RBC: 4.6 x10E6/uL (ref 3.77–5.28)
RDW: 14.5 % (ref 11.7–15.4)
WBC: 6.4 10*3/uL (ref 3.4–10.8)

## 2021-08-10 LAB — RPR: RPR Ser Ql: NONREACTIVE

## 2021-08-10 LAB — GLUCOSE TOLERANCE, 2 HOURS W/ 1HR
Glucose, 1 hour: 279 mg/dL — ABNORMAL HIGH (ref 70–179)
Glucose, 2 hour: 308 mg/dL — ABNORMAL HIGH (ref 70–152)
Glucose, Fasting: 122 mg/dL — ABNORMAL HIGH (ref 70–91)

## 2021-08-10 LAB — HIV ANTIBODY (ROUTINE TESTING W REFLEX): HIV Screen 4th Generation wRfx: NONREACTIVE

## 2021-08-15 ENCOUNTER — Telehealth: Payer: Self-pay | Admitting: Advanced Practice Midwife

## 2021-08-15 DIAGNOSIS — O24419 Gestational diabetes mellitus in pregnancy, unspecified control: Secondary | ICD-10-CM

## 2021-08-15 HISTORY — DX: Gestational diabetes mellitus in pregnancy, unspecified control: O24.419

## 2021-08-15 NOTE — Telephone Encounter (Signed)
Called pt using [employer] Tigrinian language to discuss 2 hour GTT results and dx of GDM.  Unable to leave message, after ringing, phone call ended per interpreter.  Will attempt to call pt again. Orders placed for diabetic education and supplies.

## 2021-08-16 ENCOUNTER — Other Ambulatory Visit: Payer: Self-pay

## 2021-08-16 DIAGNOSIS — O24419 Gestational diabetes mellitus in pregnancy, unspecified control: Secondary | ICD-10-CM

## 2021-08-16 MED ORDER — ACCU-CHEK GUIDE W/DEVICE KIT
1.0000 | PACK | Freq: Four times a day (QID) | 0 refills | Status: DC
Start: 2021-08-16 — End: 2022-11-09

## 2021-08-16 MED ORDER — ACCU-CHEK GUIDE VI STRP: ORAL_STRIP | 12 refills | Status: DC

## 2021-08-16 MED ORDER — ACCU-CHEK SOFTCLIX LANCETS MISC: 12 refills | Status: DC

## 2021-08-17 ENCOUNTER — Telehealth: Payer: Self-pay

## 2021-08-17 NOTE — Telephone Encounter (Addendum)
I attempted to call pt using interpreter line. Interpreter ID: [id number]. Pt did not answer and there was no option to leave voicemail.   ----- Message from Hurshel Party, CNM sent at 08/15/2021  9:40 AM EST ----- I attempted to call patient with Tigrinian interpreter today but was not able to leave a message. Call rang but then ended, so did not get to voicemail. I placed order for diabetes education but please place order for appropriate meter for GDM for this patient.  Please attempt to call her again today or tomorrow to notify her of diagnosis.  Thank you.

## 2021-08-23 ENCOUNTER — Encounter: Payer: BC Managed Care – PPO | Admitting: Nurse Practitioner

## 2021-08-25 ENCOUNTER — Encounter: Payer: Self-pay | Admitting: Obstetrics and Gynecology

## 2021-08-25 ENCOUNTER — Ambulatory Visit (INDEPENDENT_AMBULATORY_CARE_PROVIDER_SITE_OTHER): Payer: BC Managed Care – PPO | Admitting: Obstetrics and Gynecology

## 2021-08-25 ENCOUNTER — Other Ambulatory Visit: Payer: Self-pay

## 2021-08-25 VITALS — BP 112/76 | HR 134 | Wt 210.0 lb

## 2021-08-25 DIAGNOSIS — Z789 Other specified health status: Secondary | ICD-10-CM

## 2021-08-25 DIAGNOSIS — Z3401 Encounter for supervision of normal first pregnancy, first trimester: Secondary | ICD-10-CM

## 2021-08-25 DIAGNOSIS — O24410 Gestational diabetes mellitus in pregnancy, diet controlled: Secondary | ICD-10-CM

## 2021-08-25 DIAGNOSIS — Z758 Other problems related to medical facilities and other health care: Secondary | ICD-10-CM | POA: Insufficient documentation

## 2021-08-25 NOTE — Progress Notes (Signed)
Subjective:  Sarah Koch is a 30 y.o. G2P0010 at [redacted]w[redacted]d being seen today for ongoing prenatal care.  She is currently monitored for the following issues for this high-risk pregnancy and has Varicose vein of leg; Encounter for supervision of normal pregnancy in first trimester; Obesity in pregnancy; Edema during pregnancy in second trimester; Gestational diabetes mellitus (GDM) in third trimester; and Language barrier on their problem list.  Patient reports no complaints.  Contractions: Not present. Vag. Bleeding: None.  Movement: Present. Denies leaking of fluid.   The following portions of the patient's history were reviewed and updated as appropriate: allergies, current medications, past family history, past medical history, past social history, past surgical history and problem list. Problem list updated.  Objective:   Vitals:   08/25/21 0903  BP: 112/76  Pulse: (!) 134  Weight: 210 lb (95.3 kg)    Fetal Status: Fetal Heart Rate (bpm): 150   Movement: Present     General:  Alert, oriented and cooperative. Patient is in no acute distress.  Skin: Skin is warm and dry. No rash noted.   Cardiovascular: Normal heart rate noted  Respiratory: Normal respiratory effort, no problems with respiration noted  Abdomen: Soft, gravid, appropriate for gestational age. Pain/Pressure: Absent     Pelvic:  Cervical exam deferred        Extremities: Normal range of motion.  Edema: Trace  Mental Status: Normal mood and affect. Normal behavior. Normal judgment and thought content.   Urinalysis:      Assessment and Plan:  Pregnancy: G2P0010 at [redacted]w[redacted]d  1. Encounter for supervision of normal first pregnancy in first trimester Stable  2. Diet controlled gestational diabetes mellitus (GDM) in third trimester Pregnancy and DM reviewed with pt. Reduction of risks glucose control, discussed. DM educator appt reviewed with pt CBG monitoring and goals discussed Pt advised to bring all CBG readings to OB  appt 3. Language barrier Video interrupter used during today's appt  Preterm labor symptoms and general obstetric precautions including but not limited to vaginal bleeding, contractions, leaking of fluid and fetal movement were reviewed in detail with the patient. Please refer to After Visit Summary for other counseling recommendations.  Return in about 2 weeks (around 09/08/2021) for OB visit, face to face, MD only.   Hermina Staggers, MD

## 2021-08-25 NOTE — Progress Notes (Addendum)
HROB, Pt already picked up her Diabetic supplies and her appt with [facility] is 08/30/21.

## 2021-08-25 NOTE — Patient Instructions (Signed)
Gestational Diabetes Mellitus, Diagnosis Gestational diabetes mellitus is a form of diabetes. It can happen when you are pregnant. The diabetes goes away after you give birth. If you do not get treated for this condition, it may cause problems for you or your baby. What are the causes? This condition is caused by changes in your body when you are pregnant. When these happen: A part of the body called the pancreas does not make enough insulin. The body cannot use insulin in the right way. Sugars cannot get into cells in your body. The sugars stay in your blood. This leads to high blood sugar. What increases the risk? Being older than age 25 when pregnant. Having someone with diabetes in your family. Too much body weight. Having had this condition in the past. Polycystic ovary syndrome. Being pregnant with more than one baby. What are the signs or symptoms? Being thirsty often. Being hungry often. Needing to pee more often. How is this treated? Eat a healthy diet. Get more exercise. Check your blood sugar often. Take insulin and other medicines, if needed. Work with an expert on this condition, if told. Follow these instructions at home: Learn about your diabetes Ask your doctor: How often should I check my blood sugar? Where do I get the equipment? What medicines do I need? When should I take them? Do I need to meet with an educator? Who can I call if I have questions? Where can I find a support group? General instructions Take medicines only as told by your doctor. Stay at a healthy weight. Drink enough fluid to keep your pee pale yellow. Wear an alert bracelet or carry a card that shows you have this condition. Keep all follow-up visits. Where to find more information American Diabetes Association (ADA): diabetes.org Association of Diabetes Care & Education Specialists (ADCES): diabeteseducator.org Centers for Disease Control and Prevention (CDC): cdc.gov American  Pregnancy Association: americanpregnancy.org U.[place]. Department of Agriculture MyPlate: myplate.gov Contact a doctor if: Your blood sugar is at or above 240 mg/dL (13.3 mmol/L). Your blood sugar is at or above 200 mg/dL (11.1 mmol/L) and you have ketones in your pee. You have a fever. You are sick for 2 days or more and you do not get better. You have either of these problems for more than 6 hours: You vomit every time you eat or drink. You have watery poop (diarrhea). Get help right away if: You cannot think clearly. You are not breathing well. You have a lot of ketones in your pee. Your baby seems to move less than normal. Abnormal fluid or blood starts to come out of your vagina. You start having contractions before your due date. You may feel your belly tighten. You have a very bad headache. These symptoms may be an emergency. Get help right away. Call your local emergency services (911 in the [place] Do not wait to see if the symptoms will go away. Do not drive yourself to the hospital. Summary Gestational diabetes is a form of diabetes. It can happen when you are pregnant. This condition occurs when your body cannot make or use insulin in the right way. Eat a healthy diet, exercise, and use medicines or insulin as told by your doctor. Tell your doctor if your blood sugar is high, you have a fever, or you vomit every time you eat or drink. Get help right away if you cannot think clearly, you are not breathing well, or your baby seems to move less than normal. This information   is not intended to replace advice given to you by your health care provider. Make sure you discuss any questions you have with your health care provider. Document Revised: 02/09/2020 Document Reviewed: 02/09/2020 Elsevier Patient Education  2022 Elsevier Inc.  

## 2021-08-30 ENCOUNTER — Ambulatory Visit: Payer: BC Managed Care – PPO | Admitting: Registered"

## 2021-08-30 ENCOUNTER — Other Ambulatory Visit: Payer: Self-pay

## 2021-08-30 ENCOUNTER — Encounter: Payer: Self-pay | Admitting: Registered"

## 2021-08-30 ENCOUNTER — Encounter: Payer: BC Managed Care – PPO | Attending: Advanced Practice Midwife | Admitting: Registered"

## 2021-08-30 DIAGNOSIS — O24419 Gestational diabetes mellitus in pregnancy, unspecified control: Secondary | ICD-10-CM | POA: Diagnosis not present

## 2021-08-30 DIAGNOSIS — Z3A00 Weeks of gestation of pregnancy not specified: Secondary | ICD-10-CM | POA: Diagnosis not present

## 2021-08-30 NOTE — Progress Notes (Signed)
[employer]pre[employer] Lorelle Formosa # [phone]  Patient was seen for Gestational Diabetes self-management on 08/30/21  Start time 1120 and End time 1215   Estimated due date: 11/08/21; [redacted]w[redacted]d  Clinical: Medications: reviewed Medical History: reviewed Labs: OGTT 122(H)-279(H)-308(H), A1c 5.6%   Dietary and Lifestyle History: Pt reports her diet consists of traditional foods including injera, buttermilk, fruit, beans, rice  Pt states she does not walk much even on days off because cold weather, also stands entire shift at work and when gets home is tired and just sleeps. Pt states she drinks a lot of milk.  Patient states her work shift is 5 pm - 3 am.  Patient sleeps 6 am - 12 pm, eats first meal 2 pm, leaves for work 3 pm.   Physical Activity: ADL  Stress: not assessed Sleep: not assessed  24 hr Recall:  First Meal: eggs, bread, milk Snack: Second meal: Snack: Third meal: milk with sweetener added (not sugar, but something sweet), injeria Snack: Beverages: water, mango juice, orange juice, buttermilk  NUTRITION INTERVENTION  Nutrition education (E-1) on the following topics:   Initial Follow-up  []  []  Definition of Gestational Diabetes []  []  Why dietary management is important in controlling blood glucose [x]  []  Effects each nutrient has on blood glucose levels []  []  Simple carbohydrates vs complex carbohydrates []  []  Fluid intake [x]  []  Creating a balanced meal plan []  []  Carbohydrate counting  [x]  []  When to check blood glucose levels [x]  []  Proper blood glucose monitoring techniques []  []  Effect of stress and stress reduction techniques  [x]  []  Exercise effect on blood glucose levels, appropriate exercise during pregnancy []  []  Importance of limiting caffeine and abstaining from alcohol and smoking []  []  Medications used for blood sugar control during pregnancy []  []  Hypoglycemia and rule of 15 []  []  Postpartum self care  Patient already has a meter, is not testing pre  breakfast and 2 hours after each meal. Random CBG: 112 mg/dL  Patient instructed to monitor glucose levels: FBS: 60 - ? 95 mg/dL (some clinics use 90 for cutoff) 1 hour: ? 140 mg/dL 2 hour: ? mg/dL  Patient received handouts: Nutrition Diabetes and Pregnancy Carbohydrate Counting List  Patient will be seen for follow-up as needed.

## 2021-09-07 ENCOUNTER — Telehealth: Payer: Self-pay

## 2021-09-07 NOTE — Telephone Encounter (Signed)
mar/sammy-pac int#[id number] patient and advised of 9a arrival time for MFM appt on 12/29.

## 2021-09-08 ENCOUNTER — Ambulatory Visit (INDEPENDENT_AMBULATORY_CARE_PROVIDER_SITE_OTHER): Payer: Medicaid Other | Admitting: Obstetrics & Gynecology

## 2021-09-08 ENCOUNTER — Encounter: Payer: Self-pay | Admitting: Obstetrics & Gynecology

## 2021-09-08 ENCOUNTER — Other Ambulatory Visit: Payer: Self-pay

## 2021-09-08 VITALS — BP 113/79 | HR 108 | Wt 208.6 lb

## 2021-09-08 DIAGNOSIS — Z789 Other specified health status: Secondary | ICD-10-CM

## 2021-09-08 DIAGNOSIS — O24419 Gestational diabetes mellitus in pregnancy, unspecified control: Secondary | ICD-10-CM

## 2021-09-08 DIAGNOSIS — O0990 Supervision of high risk pregnancy, unspecified, unspecified trimester: Secondary | ICD-10-CM

## 2021-09-08 MED ORDER — METFORMIN HCL 500 MG PO TABS: 500.0000 mg | ORAL_TABLET | Freq: Every day | ORAL | 5 refills | Status: DC

## 2021-09-08 NOTE — Progress Notes (Signed)
Patient presents for ROB. Patient has been checking her blood glucose daily and has her log with her.

## 2021-09-08 NOTE — Progress Notes (Signed)
° °  PRENATAL VISIT NOTE  Subjective:  Sarah Koch is a 30 y.o. G2P0010 at [redacted]w[redacted]d being seen today for ongoing prenatal care.  She is currently monitored for the following issues for this high-risk pregnancy and has Varicose vein of leg; Supervision of high risk pregnancy, antepartum; Obesity in pregnancy; Edema during pregnancy in second trimester; Gestational diabetes mellitus (GDM) in third trimester; and Language barrier on their problem list.  Patient reports no complaints.  Contractions: Not present. Vag. Bleeding: None.  Movement: Present. Denies leaking of fluid.   The following portions of the patient's history were reviewed and updated as appropriate: allergies, current medications, past family history, past medical history, past social history, past surgical history and problem list.   Objective:   Vitals:   09/08/21 1122  BP: 113/79  Pulse: (!) 108  Weight: 208 lb 9.6 oz (94.6 kg)    Fetal Status: Fetal Heart Rate (bpm): 150   Movement: Present     General:  Alert, oriented and cooperative. Patient is in no acute distress.  Skin: Skin is warm and dry. No rash noted.   Cardiovascular: Normal heart rate noted  Respiratory: Normal respiratory effort, no problems with respiration noted  Abdomen: Soft, gravid, appropriate for gestational age.  Pain/Pressure: Absent     Pelvic: Cervical exam deferred        Extremities: Normal range of motion.  Edema: Mild pitting, slight indentation  Mental Status: Normal mood and affect. Normal behavior. Normal judgment and thought content.   Assessment and Plan:  Pregnancy: G2P0010 at [redacted]w[redacted]d 1. Gestational diabetes mellitus (GDM) in third trimester, gestational diabetes method of control unspecified FBS above 100 and PP up to 130, will add medication - metFORMIN (GLUCOPHAGE) 500 MG tablet; Take 1 tablet (500 mg total) by mouth daily with supper.  Dispense: 60 tablet; Refill: 5  2. Supervision of high risk pregnancy, antepartum   3.  Language barrier Interpreter via video  Preterm labor symptoms and general obstetric precautions including but not limited to vaginal bleeding, contractions, leaking of fluid and fetal movement were reviewed in detail with the patient. Please refer to After Visit Summary for other counseling recommendations.   Return in about 2 weeks (around 09/22/2021).  Future Appointments  Date Time Provider Department Center  09/13/2021 11:15 AM [facility]  09/15/2021  9:15 AM [facility] NURSE [facility]  09/15/2021  9:30 AM [facility] US3 [facility]  09/26/2021 11:15 AM Constant, Gigi Gin, MD [facility] None    Scheryl Darter, MD

## 2021-09-13 ENCOUNTER — Other Ambulatory Visit: Payer: BC Managed Care – PPO

## 2021-09-14 ENCOUNTER — Ambulatory Visit: Payer: BC Managed Care – PPO

## 2021-09-15 ENCOUNTER — Ambulatory Visit: Payer: Medicaid Other | Admitting: *Deleted

## 2021-09-15 ENCOUNTER — Ambulatory Visit: Payer: Medicaid Other | Attending: Maternal & Fetal Medicine

## 2021-09-15 ENCOUNTER — Other Ambulatory Visit: Payer: Self-pay | Admitting: Maternal & Fetal Medicine

## 2021-09-15 ENCOUNTER — Other Ambulatory Visit: Payer: Self-pay | Admitting: *Deleted

## 2021-09-15 ENCOUNTER — Other Ambulatory Visit: Payer: Self-pay

## 2021-09-15 VITALS — BP 118/66 | HR 122

## 2021-09-15 DIAGNOSIS — Z362 Encounter for other antenatal screening follow-up: Secondary | ICD-10-CM | POA: Insufficient documentation

## 2021-09-15 DIAGNOSIS — O24415 Gestational diabetes mellitus in pregnancy, controlled by oral hypoglycemic drugs: Secondary | ICD-10-CM

## 2021-09-15 DIAGNOSIS — Z3A32 32 weeks gestation of pregnancy: Secondary | ICD-10-CM | POA: Insufficient documentation

## 2021-09-15 DIAGNOSIS — E669 Obesity, unspecified: Secondary | ICD-10-CM | POA: Diagnosis not present

## 2021-09-15 DIAGNOSIS — O99213 Obesity complicating pregnancy, third trimester: Secondary | ICD-10-CM | POA: Diagnosis present

## 2021-09-15 DIAGNOSIS — O99210 Obesity complicating pregnancy, unspecified trimester: Secondary | ICD-10-CM

## 2021-09-15 DIAGNOSIS — O99212 Obesity complicating pregnancy, second trimester: Secondary | ICD-10-CM | POA: Diagnosis not present

## 2021-09-15 DIAGNOSIS — Z6834 Body mass index (BMI) 34.0-34.9, adult: Secondary | ICD-10-CM

## 2021-09-15 DIAGNOSIS — O0990 Supervision of high risk pregnancy, unspecified, unspecified trimester: Secondary | ICD-10-CM | POA: Insufficient documentation

## 2021-09-18 NOTE — L&D Delivery Note (Signed)
OB/GYN Faculty Practice Delivery Note  Sarah Koch is a 31 y.o. G2P0010 s/p SVD at [redacted]w[redacted]d. She was admitted for IOL for A2GDM on Metformin.   ROM: 23h 40m with clear fluid GBS Status: positive on PCN Maximum Maternal Temperature: 100.7  Labor Progress: Patient presented for IOL and received one cytotec and foley balloon. Once her foley balloon was out she received pitocin and eventually was AROMed. She was then internalized with an IUPC and ultimately progressed to complete. While pushing and at +4 station fetal tachycardia noted to the 170s-180s.   Delivery Date/Time: 2116 on 11/02/2021 Delivery: Called to room and patient was complete and pushing. Head delivered ROA. No nuchal cord present. Shoulder and body delivered in usual fashion. Infant with spontaneous cry, placed on mother's abdomen, dried and stimulated. Cord clamped x 2 after 1-minute delay, and cut by father of baby under my direct supervision. Cord blood drawn. Placenta delivered spontaneously with gentle cord traction. Fundus firm with massage and Pitocin. Labia, perineum, vagina, and cervix inspected and found to have a third degree (3a) laceration and a right periurethral laceration. The 3a laceration was repaired using 3-0 vicryl with two interrupted sutures.  Dr. Macon Large present for repair of third degree portion of laceration. The remainder of the laceration was repaired as a usual 2nd degree laceration. The periurethral laceration was repaired using 4-0 monocryl in running fashion.  Immediately after delivery patient had a temp of 100.7 and was treated for Triple I with ampicillin and gentamycin Placenta:  intact, 3V cord, L&D Complications: Triple I immediately preceeding delivery Lacerations: 3a and right periurethral EBL: 150cc Analgesia: epidural  Infant: female   APGARs 9,9   weight pending  Warner Mccreedy, MD, MPH OB Fellow, Faculty Practice Center for [facility], [facility] 11/02/2021, 10:18  PM

## 2021-09-21 ENCOUNTER — Ambulatory Visit (INDEPENDENT_AMBULATORY_CARE_PROVIDER_SITE_OTHER): Payer: Medicaid Other

## 2021-09-21 ENCOUNTER — Other Ambulatory Visit: Payer: Self-pay

## 2021-09-21 ENCOUNTER — Ambulatory Visit: Payer: Medicaid Other | Admitting: *Deleted

## 2021-09-21 VITALS — BP 104/70 | HR 108

## 2021-09-21 DIAGNOSIS — O24415 Gestational diabetes mellitus in pregnancy, controlled by oral hypoglycemic drugs: Secondary | ICD-10-CM

## 2021-09-21 NOTE — Progress Notes (Signed)

## 2021-09-22 ENCOUNTER — Other Ambulatory Visit: Payer: Medicaid Other

## 2021-09-26 ENCOUNTER — Other Ambulatory Visit: Payer: Self-pay

## 2021-09-26 ENCOUNTER — Ambulatory Visit (INDEPENDENT_AMBULATORY_CARE_PROVIDER_SITE_OTHER): Payer: Medicaid Other | Admitting: Obstetrics and Gynecology

## 2021-09-26 ENCOUNTER — Encounter: Payer: Self-pay | Admitting: Obstetrics and Gynecology

## 2021-09-26 VITALS — BP 109/75 | HR 109 | Wt 208.0 lb

## 2021-09-26 DIAGNOSIS — Z789 Other specified health status: Secondary | ICD-10-CM

## 2021-09-26 DIAGNOSIS — O24415 Gestational diabetes mellitus in pregnancy, controlled by oral hypoglycemic drugs: Secondary | ICD-10-CM

## 2021-09-26 DIAGNOSIS — O99210 Obesity complicating pregnancy, unspecified trimester: Secondary | ICD-10-CM

## 2021-09-26 DIAGNOSIS — O0990 Supervision of high risk pregnancy, unspecified, unspecified trimester: Secondary | ICD-10-CM

## 2021-09-26 NOTE — Progress Notes (Signed)
° °  PRENATAL VISIT NOTE  Subjective:  Sarah Koch is a 31 y.o. G2P0010 at 108w6d being seen today for ongoing prenatal care.  She is currently monitored for the following issues for this high-risk pregnancy and has Varicose vein of leg; Supervision of high risk pregnancy, antepartum; Obesity in pregnancy; Edema during pregnancy in second trimester; Gestational diabetes mellitus (GDM) in third trimester; and Language barrier on their problem list.  Patient reports no complaints.  Contractions: Not present. Vag. Bleeding: None.  Movement: Present. Denies leaking of fluid.   The following portions of the patient's history were reviewed and updated as appropriate: allergies, current medications, past family history, past medical history, past social history, past surgical history and problem list.   Objective:   Vitals:   09/26/21 1128  BP: 109/75  Pulse: (!) 109  Weight: 208 lb (94.3 kg)    Fetal Status: Fetal Heart Rate (bpm): 147 Fundal Height: 35 cm Movement: Present     General:  Alert, oriented and cooperative. Patient is in no acute distress.  Skin: Skin is warm and dry. No rash noted.   Cardiovascular: Normal heart rate noted  Respiratory: Normal respiratory effort, no problems with respiration noted  Abdomen: Soft, gravid, appropriate for gestational age.  Pain/Pressure: Absent     Pelvic: Cervical exam deferred        Extremities: Normal range of motion.  Edema: Mild pitting, slight indentation  Mental Status: Normal mood and affect. Normal behavior. Normal judgment and thought content.   Assessment and Plan:  Pregnancy: G2P0010 at [redacted]w[redacted]d 1. Supervision of high risk pregnancy, antepartum Patient is doing well without complaints  2. Gestational diabetes mellitus (GDM) in third trimester controlled on oral hypoglycemic drug CBGs reviewed and all but one pp reading within range. A few elevated fasting CBGs Will continue with metformin 500 mg qHS Encouraged patient to consume a  protein rich snack at bedtime Follow up growth ultrasound and antenatal testing scheduled  3. Obesity in pregnancy   4. Language barrier Tigrinian video interpreter used  Preterm labor symptoms and general obstetric precautions including but not limited to vaginal bleeding, contractions, leaking of fluid and fetal movement were reviewed in detail with the patient. Please refer to After Visit Summary for other counseling recommendations.   Return in about 2 weeks (around 10/10/2021) for in person, ROB, High risk.  Future Appointments  Date Time Provider [place]  09/28/2021 11:15 AM [facility] NST [facility]  10/04/2021 11:15 AM [facility] NST [facility]  10/10/2021  8:30 AM [facility] NURSE [facility]  10/10/2021  8:45 AM [facility] US6 [facility]    Shevonne Wolf, MD

## 2021-09-28 ENCOUNTER — Other Ambulatory Visit: Payer: Self-pay

## 2021-09-28 ENCOUNTER — Ambulatory Visit (INDEPENDENT_AMBULATORY_CARE_PROVIDER_SITE_OTHER): Payer: Medicaid Other

## 2021-09-28 ENCOUNTER — Ambulatory Visit: Payer: Medicaid Other | Admitting: *Deleted

## 2021-09-28 VITALS — BP 102/68 | HR 105

## 2021-09-28 DIAGNOSIS — O24415 Gestational diabetes mellitus in pregnancy, controlled by oral hypoglycemic drugs: Secondary | ICD-10-CM

## 2021-09-28 NOTE — Progress Notes (Signed)

## 2021-09-29 ENCOUNTER — Other Ambulatory Visit: Payer: Medicaid Other

## 2021-10-04 ENCOUNTER — Ambulatory Visit (INDEPENDENT_AMBULATORY_CARE_PROVIDER_SITE_OTHER): Payer: Medicaid Other

## 2021-10-04 ENCOUNTER — Other Ambulatory Visit: Payer: Self-pay

## 2021-10-04 ENCOUNTER — Ambulatory Visit: Payer: Medicaid Other | Admitting: *Deleted

## 2021-10-04 VITALS — BP 100/77 | HR 106

## 2021-10-04 DIAGNOSIS — O24415 Gestational diabetes mellitus in pregnancy, controlled by oral hypoglycemic drugs: Secondary | ICD-10-CM

## 2021-10-04 NOTE — Progress Notes (Signed)

## 2021-10-10 ENCOUNTER — Ambulatory Visit: Payer: Medicaid Other | Attending: Obstetrics and Gynecology

## 2021-10-10 ENCOUNTER — Other Ambulatory Visit: Payer: Self-pay

## 2021-10-10 ENCOUNTER — Encounter: Payer: Self-pay | Admitting: Obstetrics and Gynecology

## 2021-10-10 ENCOUNTER — Other Ambulatory Visit (HOSPITAL_COMMUNITY)
Admission: RE | Admit: 2021-10-10 | Discharge: 2021-10-10 | Disposition: A | Discharge status: Home / Self Care | Payer: Medicaid Other | Source: Ambulatory Visit | Attending: Obstetrics and Gynecology | Admitting: Obstetrics and Gynecology

## 2021-10-10 ENCOUNTER — Ambulatory Visit (INDEPENDENT_AMBULATORY_CARE_PROVIDER_SITE_OTHER): Payer: Medicaid Other | Admitting: Obstetrics and Gynecology

## 2021-10-10 ENCOUNTER — Encounter: Payer: Self-pay | Admitting: *Deleted

## 2021-10-10 ENCOUNTER — Ambulatory Visit: Payer: Medicaid Other | Admitting: *Deleted

## 2021-10-10 VITALS — BP 114/77 | HR 108 | Wt 206.6 lb

## 2021-10-10 VITALS — BP 117/70 | HR 106

## 2021-10-10 DIAGNOSIS — E668 Other obesity: Secondary | ICD-10-CM

## 2021-10-10 DIAGNOSIS — O99213 Obesity complicating pregnancy, third trimester: Secondary | ICD-10-CM | POA: Diagnosis present

## 2021-10-10 DIAGNOSIS — O0990 Supervision of high risk pregnancy, unspecified, unspecified trimester: Secondary | ICD-10-CM | POA: Insufficient documentation

## 2021-10-10 DIAGNOSIS — O99210 Obesity complicating pregnancy, unspecified trimester: Secondary | ICD-10-CM

## 2021-10-10 DIAGNOSIS — O24415 Gestational diabetes mellitus in pregnancy, controlled by oral hypoglycemic drugs: Secondary | ICD-10-CM

## 2021-10-10 DIAGNOSIS — Z3A35 35 weeks gestation of pregnancy: Secondary | ICD-10-CM | POA: Diagnosis not present

## 2021-10-10 LAB — OB RESULTS CONSOLE GC/CHLAMYDIA: Gonorrhea: NEGATIVE

## 2021-10-10 NOTE — Progress Notes (Signed)
Pt reports fetal movement, denies pain.  

## 2021-10-10 NOTE — Progress Notes (Signed)
° °  PRENATAL VISIT NOTE  Subjective:  Sarah Koch is a 31 y.o. G2P0010 at [redacted]w[redacted]d being seen today for ongoing prenatal care.  She is currently monitored for the following issues for this high-risk pregnancy and has Varicose vein of leg; Supervision of high risk pregnancy, antepartum; Obesity in pregnancy; Edema during pregnancy in second trimester; Gestational diabetes mellitus (GDM) in third trimester; and Language barrier on their problem list.  Patient reports no complaints.  Contractions: Not present. Vag. Bleeding: None.  Movement: Present. Denies leaking of fluid.   The following portions of the patient's history were reviewed and updated as appropriate: allergies, current medications, past family history, past medical history, past social history, past surgical history and problem list.   Objective:   Vitals:   10/10/21 1400  BP: 114/77  Pulse: (!) 108  Weight: 206 lb 9.6 oz (93.7 kg)    Fetal Status: Fetal Heart Rate (bpm): 156 Fundal Height: 37 cm Movement: Present  Presentation: Vertex  General:  Alert, oriented and cooperative. Patient is in no acute distress.  Skin: Skin is warm and dry. No rash noted.   Cardiovascular: Normal heart rate noted  Respiratory: Normal respiratory effort, no problems with respiration noted  Abdomen: Soft, gravid, appropriate for gestational age.  Pain/Pressure: Absent     Pelvic: Cervical exam performed in the presence of a chaperone Dilation: Closed Effacement (%): Thick Station: Ballotable  Extremities: Normal range of motion.  Edema: Mild pitting, slight indentation  Mental Status: Normal mood and affect. Normal behavior. Normal judgment and thought content.   Assessment and Plan:  Pregnancy: G2P0010 at [redacted]w[redacted]d 1. Supervision of high risk pregnancy, antepartum Patient is doing well without complaints Cultures today Does not want contraception Researching pediatrician  2. Gestational diabetes mellitus (GDM) in third trimester controlled on  oral hypoglycemic drug CBGs reviewed and all in range. Contine metformin 500 mg qHS 1/23 ultrasound EFW 3224gm and BPP 8/8 89%tile Continue weekly antenatal testing Plan for IOL at 39 weeks on 11/01/21 (induction orders in Epic, form faxed to L&D)  3. Obesity in pregnancy   Preterm labor symptoms and general obstetric precautions including but not limited to vaginal bleeding, contractions, leaking of fluid and fetal movement were reviewed in detail with the patient. Please refer to After Visit Summary for other counseling recommendations.   No follow-ups on file.  Future Appointments  Date Time Provider [place]  10/17/2021 11:15 AM [facility] NST [facility]  10/24/2021 11:15 AM [facility] NST [facility]    Mora Bellman, MD

## 2021-10-11 LAB — CERVICOVAGINAL ANCILLARY ONLY
Chlamydia: NEGATIVE
Comment: NEGATIVE
Comment: NORMAL
Neisseria Gonorrhea: NEGATIVE

## 2021-10-13 ENCOUNTER — Encounter: Payer: Self-pay | Admitting: Obstetrics and Gynecology

## 2021-10-13 DIAGNOSIS — O99820 Streptococcus B carrier state complicating pregnancy: Secondary | ICD-10-CM

## 2021-10-13 LAB — CULTURE, BETA STREP (GROUP B ONLY): Strep Gp B Culture: POSITIVE — AB

## 2021-10-17 ENCOUNTER — Ambulatory Visit (INDEPENDENT_AMBULATORY_CARE_PROVIDER_SITE_OTHER): Payer: Medicaid Other

## 2021-10-17 ENCOUNTER — Encounter: Payer: Self-pay | Admitting: Advanced Practice Midwife

## 2021-10-17 ENCOUNTER — Ambulatory Visit (INDEPENDENT_AMBULATORY_CARE_PROVIDER_SITE_OTHER): Payer: Medicaid Other | Admitting: Advanced Practice Midwife

## 2021-10-17 ENCOUNTER — Other Ambulatory Visit: Payer: Self-pay

## 2021-10-17 ENCOUNTER — Ambulatory Visit (INDEPENDENT_AMBULATORY_CARE_PROVIDER_SITE_OTHER): Payer: Medicaid Other | Admitting: General Practice

## 2021-10-17 VITALS — BP 121/74 | HR 108

## 2021-10-17 VITALS — BP 119/77 | HR 108 | Wt 207.6 lb

## 2021-10-17 DIAGNOSIS — Z3A36 36 weeks gestation of pregnancy: Secondary | ICD-10-CM

## 2021-10-17 DIAGNOSIS — O24415 Gestational diabetes mellitus in pregnancy, controlled by oral hypoglycemic drugs: Secondary | ICD-10-CM | POA: Diagnosis not present

## 2021-10-17 DIAGNOSIS — O0990 Supervision of high risk pregnancy, unspecified, unspecified trimester: Secondary | ICD-10-CM

## 2021-10-17 DIAGNOSIS — Z789 Other specified health status: Secondary | ICD-10-CM

## 2021-10-17 NOTE — Progress Notes (Signed)
Pt informed that the ultrasound is considered a limited OB ultrasound and is not intended to be a complete ultrasound exam.  Patient also informed that the ultrasound is not being completed with the intent of assessing for fetal or placental anomalies or any pelvic abnormalities.  Explained that the purpose of today’s ultrasound is to assess for  BPP, presentation, and AFI.  Patient acknowledges the purpose of the exam and the limitations of the study.    ° °Joette Schmoker H RN BSN °10/17/21 ° °

## 2021-10-17 NOTE — Progress Notes (Signed)
Pt presents for ROB. She does not have any complaints today.  PHQ9= 3 GAD7= 0

## 2021-10-17 NOTE — Progress Notes (Signed)
° °  PRENATAL VISIT NOTE  Subjective:  Sarah Koch is a 31 y.o. G2P0010 at [redacted]w[redacted]d being seen today for ongoing prenatal care.  She is currently monitored for the following issues for this low-risk pregnancy and has Varicose vein of leg; Supervision of high risk pregnancy, antepartum; Obesity in pregnancy; Edema during pregnancy in second trimester; Gestational diabetes mellitus (GDM) in third trimester; Language barrier; and GBS (group B Streptococcus carrier), +RV culture, currently pregnant on their problem list.  Patient reports no complaints.  Contractions: Irritability. Vag. Bleeding: None.  Movement: Present. Denies leaking of fluid.   The following portions of the patient's history were reviewed and updated as appropriate: allergies, current medications, past family history, past medical history, past social history, past surgical history and problem list.   Objective:   Vitals:   10/17/21 1511  BP: 119/77  Pulse: (!) 108  Weight: 207 lb 9.6 oz (94.2 kg)    Fetal Status: Fetal Heart Rate (bpm): 156 Fundal Height: 37 cm Movement: Present     General:  Alert, oriented and cooperative. Patient is in no acute distress.  Skin: Skin is warm and dry. No rash noted.   Cardiovascular: Normal heart rate noted  Respiratory: Normal respiratory effort, no problems with respiration noted  Abdomen: Soft, gravid, appropriate for gestational age.  Pain/Pressure: Present     Pelvic: Cervical exam deferred        Extremities: Normal range of motion.  Edema: Moderate pitting, indentation subsides rapidly  Mental Status: Normal mood and affect. Normal behavior. Normal judgment and thought content.   Assessment and Plan:  Pregnancy: G2P0010 at [redacted]w[redacted]d 1. Supervision of high risk pregnancy, antepartum --Anticipatory guidance about next visits/weeks of pregnancy given.  --Next appt in 1 week --Pt with note from work requesting letter to come out of work now and be out for 12 weeks postpartum. Pt unsure  if she qualifies for FMLA.  Letter provided as requested as pt is 37 weeks tomorrow.  Pt to let us know if she needs additional information.   2. Gestational diabetes mellitus (GDM) in third trimester controlled on oral hypoglycemic drug --Reviewed glucose log and all fasting glucose except 1 are in range, PP 2 out of 20 out of range.   --Continue current care --IOL at 39 weeks  3. Language barrier --Tigrinian interpreter used for all communication  4. [redacted] weeks gestation of pregnancy   Term labor symptoms and general obstetric precautions including but not limited to vaginal bleeding, contractions, leaking of fluid and fetal movement were reviewed in detail with the patient. Please refer to After Visit Summary for other counseling recommendations.   Return in about 1 week (around 10/24/2021).  Future Appointments  Date Time Provider Department Center  10/24/2021 11:15 AM [facility] NST [facility]  10/26/2021  2:30 PM [place], Brand Males, NP [facility] None    Sharen Counter, CNM

## 2021-10-24 ENCOUNTER — Other Ambulatory Visit: Payer: Self-pay

## 2021-10-24 ENCOUNTER — Ambulatory Visit (INDEPENDENT_AMBULATORY_CARE_PROVIDER_SITE_OTHER): Payer: Medicaid Other | Admitting: General Practice

## 2021-10-24 ENCOUNTER — Ambulatory Visit (INDEPENDENT_AMBULATORY_CARE_PROVIDER_SITE_OTHER): Payer: Medicaid Other

## 2021-10-24 VITALS — BP 108/63 | HR 99 | Wt 208.3 lb

## 2021-10-24 DIAGNOSIS — O99210 Obesity complicating pregnancy, unspecified trimester: Secondary | ICD-10-CM

## 2021-10-24 DIAGNOSIS — O0990 Supervision of high risk pregnancy, unspecified, unspecified trimester: Secondary | ICD-10-CM

## 2021-10-24 DIAGNOSIS — O24415 Gestational diabetes mellitus in pregnancy, controlled by oral hypoglycemic drugs: Secondary | ICD-10-CM

## 2021-10-24 DIAGNOSIS — O99820 Streptococcus B carrier state complicating pregnancy: Secondary | ICD-10-CM

## 2021-10-24 NOTE — Progress Notes (Signed)
Pt informed that the ultrasound is considered a limited OB ultrasound and is not intended to be a complete ultrasound exam.  Patient also informed that the ultrasound is not being completed with the intent of assessing for fetal or placental anomalies or any pelvic abnormalities.  Explained that the purpose of today’s ultrasound is to assess for  BPP, presentation, and AFI.  Patient acknowledges the purpose of the exam and the limitations of the study.    ° °Tiernan Millikin H RN BSN °10/24/21 ° °

## 2021-10-26 ENCOUNTER — Ambulatory Visit (INDEPENDENT_AMBULATORY_CARE_PROVIDER_SITE_OTHER): Payer: Medicaid Other | Admitting: Nurse Practitioner

## 2021-10-26 ENCOUNTER — Other Ambulatory Visit: Payer: Self-pay

## 2021-10-26 ENCOUNTER — Other Ambulatory Visit: Payer: Self-pay | Admitting: Advanced Practice Midwife

## 2021-10-26 VITALS — BP 118/83 | HR 93 | Wt 209.0 lb

## 2021-10-26 DIAGNOSIS — Z789 Other specified health status: Secondary | ICD-10-CM

## 2021-10-26 DIAGNOSIS — Z3A38 38 weeks gestation of pregnancy: Secondary | ICD-10-CM

## 2021-10-26 DIAGNOSIS — O99820 Streptococcus B carrier state complicating pregnancy: Secondary | ICD-10-CM

## 2021-10-26 DIAGNOSIS — O24415 Gestational diabetes mellitus in pregnancy, controlled by oral hypoglycemic drugs: Secondary | ICD-10-CM

## 2021-10-26 DIAGNOSIS — O0990 Supervision of high risk pregnancy, unspecified, unspecified trimester: Secondary | ICD-10-CM

## 2021-10-27 ENCOUNTER — Telehealth (HOSPITAL_COMMUNITY): Payer: Self-pay | Admitting: *Deleted

## 2021-10-27 NOTE — Progress Notes (Signed)
° ° °  Subjective:  Sarah Koch is a 31 y.o. G2P0010 at [redacted]w[redacted]d being seen today for ongoing prenatal care.  She is currently monitored for the following issues for this high-risk pregnancy and has Varicose vein of leg; Supervision of high risk pregnancy, antepartum; Obesity in pregnancy; Edema during pregnancy in second trimester; Gestational diabetes mellitus (GDM) in third trimester; Language barrier; GBS (group B Streptococcus carrier), +RV culture, currently pregnant; and Hyperhidrosis of axilla on their problem list.  Patient reports no complaints.  Contractions: Irritability. Vag. Bleeding: None.  Movement: Present. Denies leaking of fluid.   The following portions of the patient's history were reviewed and updated as appropriate: allergies, current medications, past family history, past medical history, past social history, past surgical history and problem list. Problem list updated.  Objective:   Vitals:   10/26/21 1459  BP: 118/83  Pulse: 93  Weight: 209 lb (94.8 kg)    Fetal Status: Fetal Heart Rate (bpm): 154 Fundal Height: 37 cm Movement: Present     General:  Alert, oriented and cooperative. Patient is in no acute distress.  Skin: Skin is warm and dry. No rash noted.   Cardiovascular: Normal heart rate noted  Respiratory: Normal respiratory effort, no problems with respiration noted  Abdomen: Soft, gravid, appropriate for gestational age. Pain/Pressure: Absent     Pelvic:  Cervical exam deferred        Extremities: Normal range of motion.  Edema: Moderate pitting, indentation subsides rapidly  Mental Status: Normal mood and affect. Normal behavior. Normal judgment and thought content.   Urinalysis:      Assessment and Plan:  Pregnancy: G2P0010 at [redacted]w[redacted]d  1. Supervision of high risk pregnancy, antepartum Consult with Dr. Alysia Penna - will schedule induction for 39 weeks Signed and held orders are on the chart   2. GBS (group B Streptococcus carrier), +RV culture, currently  pregnant Will treat in labor  3. Gestational diabetes mellitus (GDM) in third trimester controlled on oral hypoglycemic drug Had BPP this week Taking metformin as prescribed Brought record of glucose reading - most were normal.  A very few elevated readings - had eaten cake on those days.  4. Language barrier Video interpreter present for the entire visit.  5. [redacted] weeks gestation of pregnancy   Term labor symptoms and general obstetric precautions including but not limited to vaginal bleeding, contractions, leaking of fluid and fetal movement were reviewed in detail with the patient. Please refer to After Visit Summary for other counseling recommendations.  Return for schedule for PP visit - will be induced next week.  Nolene Bernheim, RN, MSN, NP-BC Nurse Practitioner, [facility] for [employer], [facility] 10/27/2021 9:29 PM

## 2021-10-27 NOTE — Telephone Encounter (Signed)
Preadmission screen Interpreter number [phone]

## 2021-10-28 ENCOUNTER — Other Ambulatory Visit: Payer: Self-pay | Admitting: Advanced Practice Midwife

## 2021-10-28 ENCOUNTER — Telehealth (HOSPITAL_COMMUNITY): Payer: Self-pay | Admitting: *Deleted

## 2021-10-28 NOTE — Telephone Encounter (Signed)
[phone] interpreter number  Preadmission screen

## 2021-10-31 ENCOUNTER — Other Ambulatory Visit: Payer: Self-pay

## 2021-10-31 ENCOUNTER — Ambulatory Visit (INDEPENDENT_AMBULATORY_CARE_PROVIDER_SITE_OTHER): Payer: Medicaid Other | Admitting: General Practice

## 2021-10-31 ENCOUNTER — Other Ambulatory Visit (HOSPITAL_COMMUNITY): Payer: Self-pay | Admitting: *Deleted

## 2021-10-31 ENCOUNTER — Ambulatory Visit (INDEPENDENT_AMBULATORY_CARE_PROVIDER_SITE_OTHER): Payer: Medicaid Other

## 2021-10-31 VITALS — BP 132/83 | HR 98

## 2021-10-31 DIAGNOSIS — O99820 Streptococcus B carrier state complicating pregnancy: Secondary | ICD-10-CM

## 2021-10-31 DIAGNOSIS — O24415 Gestational diabetes mellitus in pregnancy, controlled by oral hypoglycemic drugs: Secondary | ICD-10-CM

## 2021-10-31 DIAGNOSIS — O0990 Supervision of high risk pregnancy, unspecified, unspecified trimester: Secondary | ICD-10-CM

## 2021-10-31 DIAGNOSIS — Z3A38 38 weeks gestation of pregnancy: Secondary | ICD-10-CM | POA: Diagnosis not present

## 2021-10-31 DIAGNOSIS — O99210 Obesity complicating pregnancy, unspecified trimester: Secondary | ICD-10-CM

## 2021-10-31 LAB — FETAL NONSTRESS TEST

## 2021-10-31 NOTE — Progress Notes (Signed)
Pt informed that the ultrasound is considered a limited OB ultrasound and is not intended to be a complete ultrasound exam.  Patient also informed that the ultrasound is not being completed with the intent of assessing for fetal or placental anomalies or any pelvic abnormalities.  Explained that the purpose of today’s ultrasound is to assess for  BPP, presentation, and AFI.  Patient acknowledges the purpose of the exam and the limitations of the study.    ° °Sarah Koch H RN BSN °10/31/21 ° °

## 2021-11-01 ENCOUNTER — Inpatient Hospital Stay (HOSPITAL_COMMUNITY): Payer: Medicaid Other

## 2021-11-01 ENCOUNTER — Encounter (HOSPITAL_COMMUNITY): Payer: Self-pay | Admitting: Obstetrics and Gynecology

## 2021-11-01 ENCOUNTER — Inpatient Hospital Stay (HOSPITAL_COMMUNITY)
Admission: AD | Admit: 2021-11-01 | Discharge: 2021-11-04 | DRG: 768 | Disposition: A | Discharge status: Home / Self Care | Payer: Medicaid Other | Attending: Obstetrics & Gynecology | Admitting: Obstetrics & Gynecology

## 2021-11-01 DIAGNOSIS — O411230 Chorioamnionitis, third trimester, not applicable or unspecified: Secondary | ICD-10-CM | POA: Diagnosis present

## 2021-11-01 DIAGNOSIS — Z3A39 39 weeks gestation of pregnancy: Secondary | ICD-10-CM

## 2021-11-01 DIAGNOSIS — O99214 Obesity complicating childbirth: Secondary | ICD-10-CM | POA: Diagnosis present

## 2021-11-01 DIAGNOSIS — O24419 Gestational diabetes mellitus in pregnancy, unspecified control: Secondary | ICD-10-CM | POA: Diagnosis present

## 2021-11-01 DIAGNOSIS — O99824 Streptococcus B carrier state complicating childbirth: Secondary | ICD-10-CM | POA: Diagnosis present

## 2021-11-01 DIAGNOSIS — Z7982 Long term (current) use of aspirin: Secondary | ICD-10-CM

## 2021-11-01 DIAGNOSIS — Z3490 Encounter for supervision of normal pregnancy, unspecified, unspecified trimester: Secondary | ICD-10-CM | POA: Diagnosis present

## 2021-11-01 DIAGNOSIS — O24425 Gestational diabetes mellitus in childbirth, controlled by oral hypoglycemic drugs: Principal | ICD-10-CM | POA: Diagnosis present

## 2021-11-01 DIAGNOSIS — O99820 Streptococcus B carrier state complicating pregnancy: Secondary | ICD-10-CM

## 2021-11-01 DIAGNOSIS — Z20822 Contact with and (suspected) exposure to covid-19: Secondary | ICD-10-CM | POA: Diagnosis present

## 2021-11-01 DIAGNOSIS — Z789 Other specified health status: Secondary | ICD-10-CM | POA: Diagnosis present

## 2021-11-01 DIAGNOSIS — O0990 Supervision of high risk pregnancy, unspecified, unspecified trimester: Secondary | ICD-10-CM

## 2021-11-01 DIAGNOSIS — O99210 Obesity complicating pregnancy, unspecified trimester: Secondary | ICD-10-CM | POA: Diagnosis present

## 2021-11-01 DIAGNOSIS — O1202 Gestational edema, second trimester: Secondary | ICD-10-CM | POA: Diagnosis present

## 2021-11-01 DIAGNOSIS — Z603 Acculturation difficulty: Secondary | ICD-10-CM | POA: Diagnosis present

## 2021-11-01 LAB — GLUCOSE, CAPILLARY
Glucose-Capillary: 104 mg/dL — ABNORMAL HIGH (ref 70–99)
Glucose-Capillary: 86 mg/dL (ref 70–99)
Glucose-Capillary: 94 mg/dL (ref 70–99)
Glucose-Capillary: 80 mg/dL (ref 70–99)

## 2021-11-01 LAB — COMPREHENSIVE METABOLIC PANEL
ALT: 18 U/L (ref 0–44)
AST: 33 U/L (ref 15–41)
Albumin: 2.9 g/dL — ABNORMAL LOW (ref 3.5–5.0)
Alkaline Phosphatase: 132 U/L — ABNORMAL HIGH (ref 38–126)
Anion gap: 10 (ref 5–15)
BUN: 5 mg/dL — ABNORMAL LOW (ref 6–20)
CO2: 22 mmol/L (ref 22–32)
Calcium: 10.4 mg/dL — ABNORMAL HIGH (ref 8.9–10.3)
Chloride: 103 mmol/L (ref 98–111)
Creatinine, Ser: 0.57 mg/dL (ref 0.44–1.00)
GFR, Estimated: >60 mL/min (ref >60)
Glucose, Bld: 108 mg/dL — ABNORMAL HIGH (ref 70–99)
Potassium: 3.9 mmol/L (ref 3.5–5.1)
Sodium: 135 mmol/L (ref 135–145)
Total Bilirubin: 0.6 mg/dL (ref 0.3–1.2)
Total Protein: 6.6 g/dL (ref 6.5–8.1)

## 2021-11-01 LAB — CBC
HCT: 46.2 % — ABNORMAL HIGH (ref 36.0–46.0)
Hemoglobin: 15.3 g/dL — ABNORMAL HIGH (ref 12.0–15.0)
MCH: 28 pg (ref 26.0–34.0)
MCHC: 33.1 g/dL (ref 30.0–36.0)
MCV: 84.6 fL (ref 80.0–100.0)
Platelets: 155 10*3/uL (ref 150–400)
RBC: 5.46 MIL/uL — ABNORMAL HIGH (ref 3.87–5.11)
RDW: 16.1 % — ABNORMAL HIGH (ref 11.5–15.5)
WBC: 7.1 10*3/uL (ref 4.0–10.5)
nRBC: 0 % (ref 0.0–0.2)

## 2021-11-01 LAB — RESP PANEL BY RT-PCR (FLU A&B, COVID) ARPGX2
Influenza A by PCR: NEGATIVE
Influenza B by PCR: NEGATIVE
SARS Coronavirus 2 by RT PCR: NEGATIVE

## 2021-11-01 LAB — TYPE AND SCREEN
ABO/RH(D): B POS
Antibody Screen: NEGATIVE

## 2021-11-01 MED ORDER — OXYCODONE-ACETAMINOPHEN 5-325 MG PO TABS: 1.0000 | ORAL_TABLET | ORAL | Status: DC | PRN

## 2021-11-01 MED ORDER — TERBUTALINE SULFATE 1 MG/ML IJ SOLN: 0.2500 mg | Freq: Once | INTRAMUSCULAR | Status: DC | PRN

## 2021-11-01 MED ORDER — OXYTOCIN-SODIUM CHLORIDE 30-0.9 UT/500ML-% IV SOLN: 2.5000 [IU]/h | INTRAVENOUS | Status: DC

## 2021-11-01 MED ORDER — PENICILLIN G POT IN DEXTROSE 60000 UNIT/ML IV SOLN
3.0000 10*6.[IU] | INTRAVENOUS | Status: DC
  Administered 2021-11-01 – 2021-11-02 (×9): 3 10*6.[IU] via INTRAVENOUS
  Filled 2021-11-01 (×10): qty 50

## 2021-11-01 MED ORDER — SOD CITRATE-CITRIC ACID 500-334 MG/5ML PO SOLN: 30.0000 mL | ORAL | Status: DC | PRN

## 2021-11-01 MED ORDER — OXYCODONE-ACETAMINOPHEN 5-325 MG PO TABS: 2.0000 | ORAL_TABLET | ORAL | Status: DC | PRN

## 2021-11-01 MED ORDER — SODIUM CHLORIDE 0.9 % IV SOLN
5.0000 10*6.[IU] | Freq: Once | INTRAVENOUS | Status: AC
  Administered 2021-11-01: 5 10*6.[IU] via INTRAVENOUS
  Filled 2021-11-01: qty 5

## 2021-11-01 MED ORDER — LIDOCAINE HCL (PF) 1 % IJ SOLN: 30.0000 mL | INTRAMUSCULAR | Status: DC | PRN

## 2021-11-01 MED ORDER — MISOPROSTOL 50MCG HALF TABLET
50.0000 ug | ORAL_TABLET | ORAL | Status: DC | PRN
  Administered 2021-11-01: 50 ug via BUCCAL
  Filled 2021-11-01 (×2): qty 1

## 2021-11-01 MED ORDER — OXYTOCIN BOLUS FROM INFUSION
333.0000 mL | Freq: Once | INTRAVENOUS | Status: AC
  Administered 2021-11-02: 333 mL via INTRAVENOUS

## 2021-11-01 MED ORDER — FENTANYL CITRATE (PF) 100 MCG/2ML IJ SOLN
100.0000 ug | INTRAMUSCULAR | Status: DC | PRN
  Administered 2021-11-01 – 2021-11-02 (×9): 100 ug via INTRAVENOUS
  Filled 2021-11-01 (×9): qty 2

## 2021-11-01 MED ORDER — ACETAMINOPHEN 325 MG PO TABS: 650.0000 mg | ORAL_TABLET | ORAL | Status: DC | PRN

## 2021-11-01 MED ORDER — ONDANSETRON HCL 4 MG/2ML IJ SOLN: 4.0000 mg | Freq: Four times a day (QID) | INTRAMUSCULAR | Status: DC | PRN

## 2021-11-01 MED ORDER — LACTATED RINGERS IV SOLN
500.0000 mL | INTRAVENOUS | Status: DC | PRN
  Administered 2021-11-01: 500 mL via INTRAVENOUS

## 2021-11-01 MED ORDER — OXYTOCIN-SODIUM CHLORIDE 30-0.9 UT/500ML-% IV SOLN
1.0000 m[IU]/min | INTRAVENOUS | Status: DC
  Administered 2021-11-01: 2 m[IU]/min via INTRAVENOUS
  Filled 2021-11-01 (×2): qty 500

## 2021-11-01 MED ORDER — LACTATED RINGERS IV SOLN: INTRAVENOUS | Status: DC

## 2021-11-01 NOTE — Progress Notes (Signed)
Labor Progress Note Sarah Koch is a 31 y.o. G2P0010 at [redacted]w[redacted]d by 10-week ultrasound who presented for IOL due to A2GDM.  S: Doing well per RN. No concerns at this time. Foley balloon out now. Pain well controlled with prn fentanyl.   O:  BP 118/80    Pulse (!) 110    Temp 98.5 F (36.9 C) (Oral)    Resp 18    Ht 5' 2.99" (1.6 m)    Wt 93.9 kg    LMP 02/09/2021    BMI 36.68 kg/m  EFM: Baseline FHR 145 bpm/moderate variability/+accels, no decels  CVE: Dilation: 4 Effacement (%): 80 Station: -3 Presentation: Vertex Exam by:: Krystal Eaton RN   A&P: 31 y.o. G2P0010 [redacted]w[redacted]d presenting for IOL due to A2GDM.  #Labor: Progressing well. Foley balloon out at 1600. Pitocin started for augmentation. Will reassess in 4 hours or sooner if increased discomfort. #Pain: PRN #FWB: Cat 1 #GBS positive: PCN ordered  #A2GDM: CBG well controlled, continue q4h checks while in latent labor.    [name] 6:38 PM

## 2021-11-01 NOTE — Progress Notes (Signed)
Sarah Koch is a 31 y.o. G2P0010 at [redacted]w[redacted]d admitted for induction of labor due to A2GDM.  Subjective: Patient reports she feels ok. Not feeling a lot of pressure or pain. Felt okay going on her walk.  Objective: BP 127/88    Pulse (!) 108    Temp 97.9 F (36.6 C) (Oral)    Resp 15    Ht 5' 2.99" (1.6 m)    Wt 93.9 kg    LMP 02/09/2021    BMI 36.68 kg/m  I/O last 3 completed shifts: In: 851.3 [I.V.:851.3] Out: -  No intake/output data recorded.  FHT:  FHR: 150 bpm, variability: moderate,  accelerations:  Present,  decelerations:  Absent UC:   irregular, every 1-3 minutes SVE:   Dilation: 3.5 Effacement (%): 60 Station: -1 Exam by:: Dr. Ephriam Jenkins  Labs: Lab Results  Component Value Date   WBC 7.1 11/01/2021   HGB 15.3 (H) 11/01/2021   HCT 46.2 (H) 11/01/2021   MCV 84.6 11/01/2021   PLT 155 11/01/2021    Assessment / Plan: G2P0010 at [redacted]w[redacted]d admitted for induction of labor due to A2GDM.  Labor:  fetal head with significant descent from last check at -1. Cervix still 3-4 cm. Head well applied and therefore AROMed during current check. Tolerated well by patient and fetus. Will continue pitocin and reassess in 4 hours for cervical change.  Fetal Wellbeing:  Category I Pain Control:   plans epidural. Discussed with patient that after AROM contractions tend to get more intense and frequent. She can opt for epidural or more IV pain medicine at anytime. I/D:   GBS pos, on PCN (already received loading dose and three additional doses)   #A2GDM On metformin outpatient. BG within range in 80s-90s. Continue q4hr checks.   Warner Mccreedy, MD, MPH OB Fellow, Faculty Practice

## 2021-11-01 NOTE — Progress Notes (Signed)
Induction orders released

## 2021-11-01 NOTE — H&P (Addendum)
OBSTETRIC ADMISSION HISTORY AND PHYSICAL  Sarah Koch is a 31 y.o. female G2P0010 with IUP at 34w0dby 10-week [place] for IOL secondary to [place](on Metformin). She reports +FMs, no LOF, no VB, no blurry vision, headaches, or RUQ pain. She does report mild peripheral edema. She plans on breast feeding. She does not plan to use birth control for contraception postpartum.  She received her prenatal care at [facility].  Dating: By 10-week [place]--->  Estimated Date of Delivery: 11/08/21  Sono:   @[redacted]w[redacted]d , normal anatomy, cephalic presentation, anterior placental lie, 3224 g, 89% EFW  Prenatal History/Complications:  --GDMA2 on Metformin --Elevated BMI (36) --GBS positive --Language barrier  Past Medical History: Past Medical History:  Diagnosis Date   Medical history non-contributory     Past Surgical History: Past Surgical History:  Procedure Laterality Date   NO PAST SURGERIES      Obstetrical History: OB History     Gravida  2   Para      Term      Preterm      AB  1   Living         SAB  1   IAB      Ectopic      Multiple      Live Births              Social History Social History   Socioeconomic History   Marital status: Single    Spouse name: Not on file   Number of children: Not on file   Years of education: Not on file   Highest education level: Not on file  Occupational History   Not on file  Tobacco Use   Smoking status: Never   Smokeless tobacco: Never  Vaping Use   Vaping Use: Never used  Substance and Sexual Activity   Alcohol use: No    Alcohol/week: 0.0 standard drinks   Drug use: No   Sexual activity: Yes    Partners: Male    Birth control/protection: None  Other Topics Concern   Not on file  Social History Narrative   Not on file   Social Determinants of Health   Financial Resource Strain: Not on file  Food Insecurity: Not on file  Transportation Needs: Not on file  Physical Activity: Not on file  Stress:  Not on file  Social Connections: Not on file    Family History: History reviewed. No pertinent family history.  Allergies: No Known Allergies  Medications Prior to Admission  Medication Sig Dispense Refill Last Dose   Accu-Chek Softclix Lancets lancets Use as instructed 100 each 12    aspirin EC 81 MG tablet Take 1 tablet (81 mg total) by mouth daily. Swallow whole. 30 tablet 5    Blood Glucose Monitoring Suppl (ACCU-CHEK GUIDE) w/Device KIT 1 Device by Does not apply route 4 (four) times daily. 1 kit 0    ferrous sulfate 324 MG TBEC Take 324 mg by mouth.      glucose blood (ACCU-CHEK GUIDE) test strip Use to check blood sugars four times a day was instructed 50 each 12    metFORMIN (GLUCOPHAGE) 500 MG tablet Take 1 tablet (500 mg total) by mouth daily with supper. 60 tablet 5    Prenatal Vit-Fe Fumarate-FA (PREPLUS) 27-1 MG TABS Take 1 tablet by mouth daily. 30 tablet 13      Review of Systems  All systems reviewed and negative except as stated in HPI  Blood pressure  119/81, pulse (!) 103, temperature 98.6 F (37 C), temperature source Oral, resp. rate 18, height 5' 2.99" (1.6 m), weight 93.9 kg, last menstrual period 02/09/2021, unknown if currently breastfeeding.  General appearance: alert, cooperative, and no distress Lungs: normal work of breathing on room air Heart:  normal rate, warm and well perfused  Abdomen: soft, non-tender; gravid Extremities: no lower extremity edema or calf tenderness to palpation   Presentation: Cephalic per RN Fetal monitoring: Baseline FHR 140 bpm, moderate variability, +accels, no decels Uterine activity: Contractions every 2-5 minutes Dilation: Closed Effacement (%): Thick Station: -3 Exam by:: Sarah Eaton RN   Prenatal labs: ABO, Rh: B+ Antibody: Negative Rubella: 5.74 (08/08 1650) RPR: Non Reactive (11/22 1007)  HBsAg: Negative (08/08 1650)  HIV: Non Reactive (11/22 1007)  GBS: Positive/-- (01/23 1457)  Abnormal GTT-  A2GDM Genetic screening  Low-risk NIPS, negative [employer] US normal  Prenatal Transfer Tool  Maternal Diabetes: Yes:  Diabetes Type:  Insulin/Medication controlled, Metformin Genetic Screening: Normal Maternal Ultrasounds/Referrals: Normal Fetal Ultrasounds or other Referrals:  None Maternal Substance Abuse:  No Significant Maternal Medications:  Meds include: Other:  Metformin Significant Maternal Lab Results: Group B Strep positive  Results for orders placed or performed during the hospital encounter of 11/01/21 (from the past 24 hour(s))  CBC   Collection Time: 11/01/21  8:28 AM  Result Value Ref Range   WBC 7.1 4.0 - 10.5 K/uL   RBC 5.46 (H) 3.87 - 5.11 MIL/uL   Hemoglobin 15.3 (H) 12.0 - 15.0 g/dL   HCT 46.2 (H) 36.0 - 46.0 %   MCV 84.6 80.0 - 100.0 fL   MCH 28.0 26.0 - 34.0 pg   MCHC 33.1 30.0 - 36.0 g/dL   RDW 16.1 (H) 11.5 - 15.5 %   Platelets 155 150 - 400 K/uL   nRBC 0.0 0.0 - 0.2 %  Comprehensive metabolic panel   Collection Time: 11/01/21  8:28 AM  Result Value Ref Range   Sodium 135 135 - 145 mmol/L   Potassium 3.9 3.5 - 5.1 mmol/L   Chloride 103 98 - 111 mmol/L   CO2 22 22 - 32 mmol/L   Glucose, Bld 108 (H) 70 - 99 mg/dL   BUN 5 (L) 6 - 20 mg/dL   Creatinine, Ser 0.57 0.44 - 1.00 mg/dL   Calcium 10.4 (H) 8.9 - 10.3 mg/dL   Total Protein 6.6 6.5 - 8.1 g/dL   Albumin 2.9 (L) 3.5 - 5.0 g/dL   AST 33 15 - 41 U/L   ALT 18 0 - 44 U/L   Alkaline Phosphatase 132 (H) 38 - 126 U/L   Total Bilirubin 0.6 0.3 - 1.2 mg/dL   GFR, Estimated >60 >60 mL/min   Anion gap 10 5 - 15  Type and screen   Collection Time: 11/01/21  8:28 AM  Result Value Ref Range   ABO/RH(D) PENDING    Antibody Screen PENDING    Sample Expiration      11/04/2021,2359 Performed at [facility], [address], [place], [place] [address]   Glucose, capillary   Collection Time: 11/01/21  9:18 AM  Result Value Ref Range   Glucose-Capillary 104 (H) 70 - 99 mg/dL   Comment 1  Notify RN    Comment 2 Document in Chart     Patient Active Problem List   Diagnosis Date Noted   Encounter for induction of labor 11/01/2021   GBS (group B Streptococcus carrier), +RV culture, currently pregnant 10/13/2021  Language barrier 08/25/2021   Gestational diabetes mellitus (GDM) in third trimester 08/15/2021   Edema during pregnancy in second trimester 07/19/2021   Obesity in pregnancy 05/24/2021   Supervision of high risk pregnancy, antepartum 04/14/2021   Hyperhidrosis of axilla 10/21/2018   Varicose vein of leg 01/04/2016    Assessment/Plan:  Kinzie Wickes is a 31 y.o. G2P0010 at 86w0dhere for IOL due to A2GDM.  #Labor: Will start induction with buccal Cytotec for cervical ripening. Will reassess in 4h and attempt foley balloon placement as able.  #FWB: Category 1 #ID: GBS pos; PCN ordered #MOF: Breast #MOC: None  #A2GDM: Well-controlled on Metformin throughout pregnancy. Initial glucose 104 on arrival, at goal. Plan for q4hr glucose checks in latent labor and will assess need for medication PRN. EFW 89% at 332w2d SaVennie HomansDO Res 11/01/2021, 9:39 AM  GME ATTESTATION:  I saw and evaluated the patient. I agree with the findings and the plan of care as documented in the residents note. I have made changes to documentation as necessary.  [place]enterpreter used via iPad [employer] interpreter for entirety of encounter.  Cervical ripening with Cytotec. FB when able. CBG at goal. Will continue to monitor. EFW 89%.   ChVilma MeckelMD OB Fellow, [facility] [place]/14/2023 10:33 AM

## 2021-11-01 NOTE — Progress Notes (Signed)
Sarah Koch is a 31 y.o. G2P0010 at [redacted]w[redacted]d by 10-week ultrasound admitted for induction of labor due to A2GDM.  Subjective: Doing well, ambulating around room. Pain well controlled without medication.  Objective: BP 111/76    Pulse (!) 103    Temp 98.1 F (36.7 C) (Oral)    Resp 18    Ht 5' 2.99" (1.6 m)    Wt 93.9 kg    LMP 02/09/2021    BMI 36.68 kg/m  No intake/output data recorded. No intake/output data recorded.  FHT:  Baseline FHR 135, moderate variability, +accels, no decels UC:   every 1 minute SVE:   Dilation: 1 Effacement (%): 60 Station: -3 Exam by:: Dr. Mathis Fare  Labs: Lab Results  Component Value Date   WBC 7.1 11/01/2021   HGB 15.3 (H) 11/01/2021   HCT 46.2 (H) 11/01/2021   MCV 84.6 11/01/2021   PLT 155 11/01/2021    Assessment / Plan: Induction of labor due to A2GDM .  Labor: Progressing, foley balloon placed. Will reassess in 4 hours.  Fetal Wellbeing:  Category I Pain Control:  Considering IV fentanyl prn  I/D:   GBS pos; PCN Ordered Anticipated MOD:  NSVD  Myrna Blazer, DO Res 11/01/2021, 1:50 PM

## 2021-11-02 ENCOUNTER — Inpatient Hospital Stay (HOSPITAL_COMMUNITY): Payer: Medicaid Other | Admitting: Anesthesiology

## 2021-11-02 DIAGNOSIS — Z3A39 39 weeks gestation of pregnancy: Secondary | ICD-10-CM

## 2021-11-02 DIAGNOSIS — O99824 Streptococcus B carrier state complicating childbirth: Secondary | ICD-10-CM

## 2021-11-02 DIAGNOSIS — O24425 Gestational diabetes mellitus in childbirth, controlled by oral hypoglycemic drugs: Secondary | ICD-10-CM

## 2021-11-02 DIAGNOSIS — O411230 Chorioamnionitis, third trimester, not applicable or unspecified: Secondary | ICD-10-CM

## 2021-11-02 LAB — RPR: RPR Ser Ql: NONREACTIVE

## 2021-11-02 LAB — GLUCOSE, CAPILLARY
Glucose-Capillary: 94 mg/dL (ref 70–99)
Glucose-Capillary: 78 mg/dL (ref 70–99)
Glucose-Capillary: 74 mg/dL (ref 70–99)
Glucose-Capillary: 42 mg/dL — CL (ref 70–99)
Glucose-Capillary: 90 mg/dL (ref 70–99)
Glucose-Capillary: 111 mg/dL — ABNORMAL HIGH (ref 70–99)
Glucose-Capillary: 94 mg/dL (ref 70–99)
Glucose-Capillary: 81 mg/dL (ref 70–99)

## 2021-11-02 MED ORDER — PHENYLEPHRINE 40 MCG/ML (10ML) SYRINGE FOR IV PUSH (FOR BLOOD PRESSURE SUPPORT): 80.0000 ug | PREFILLED_SYRINGE | INTRAVENOUS | Status: DC | PRN

## 2021-11-02 MED ORDER — SODIUM CHLORIDE 0.9 % IV SOLN
2.0000 g | Freq: Four times a day (QID) | INTRAVENOUS | Status: AC
  Administered 2021-11-03 (×3): 2 g via INTRAVENOUS
  Filled 2021-11-02 (×3): qty 2000

## 2021-11-02 MED ORDER — LACTATED RINGERS IV SOLN
500.0000 mL | Freq: Once | INTRAVENOUS | Status: AC
  Administered 2021-11-02: 500 mL via INTRAVENOUS

## 2021-11-02 MED ORDER — FENTANYL-BUPIVACAINE-NACL 0.5-0.125-0.9 MG/250ML-% EP SOLN
12.0000 mL/h | EPIDURAL | Status: DC | PRN
  Administered 2021-11-02: 12 mL/h via EPIDURAL
  Filled 2021-11-02: qty 250

## 2021-11-02 MED ORDER — EPHEDRINE 5 MG/ML INJ: 10.0000 mg | INTRAVENOUS | Status: DC | PRN

## 2021-11-02 MED ORDER — GENTAMICIN SULFATE 40 MG/ML IJ SOLN
5.0000 mg/kg | INTRAVENOUS | Status: DC
  Administered 2021-11-02: 350 mg via INTRAVENOUS
  Filled 2021-11-02: qty 8.75

## 2021-11-02 MED ORDER — LIDOCAINE HCL (PF) 1 % IJ SOLN
INTRAMUSCULAR | Status: DC | PRN
  Administered 2021-11-02: 11 mL via EPIDURAL

## 2021-11-02 MED ORDER — ACETAMINOPHEN 500 MG PO TABS
1000.0000 mg | ORAL_TABLET | Freq: Three times a day (TID) | ORAL | Status: DC
  Administered 2021-11-02: 1000 mg via ORAL
  Filled 2021-11-02: qty 2

## 2021-11-02 MED ORDER — SODIUM CHLORIDE 0.9 % IV SOLN
2.0000 g | Freq: Four times a day (QID) | INTRAVENOUS | Status: DC
  Administered 2021-11-02: 2 g via INTRAVENOUS
  Filled 2021-11-02: qty 2000

## 2021-11-02 MED ORDER — DIPHENHYDRAMINE HCL 50 MG/ML IJ SOLN: 12.5000 mg | INTRAMUSCULAR | Status: DC | PRN

## 2021-11-02 NOTE — Progress Notes (Signed)
LABOR PROGRESS NOTE  Sarah Koch is a 31 y.o. G2P0010 at [redacted]w[redacted]d  admitted for IOL d/t A2GDM  Subjective: Pt is doing well, received epidural which is working  Objective: BP 117/77    Pulse 100    Temp 97.8 F (36.6 C) (Oral)    Resp 18    Ht 5' 2.99" (1.6 m)    Wt 93.9 kg    LMP 02/09/2021    BMI 36.68 kg/m  or  Vitals:   11/02/21 1041 11/02/21 1100 11/02/21 1131 11/02/21 1201  BP: 120/77 115/64 118/84 117/77  Pulse: 92 91 95 100  Resp: 18     Temp:      TempSrc:      Weight:      Height:         Dilation: 8 Effacement (%): 90 Station: -1 Presentation: Vertex Exam by:: Lisa,CNM/Dr. Ronnald Ramp FHT: baseline rate 150, moderate varibility, accels present, no decels Toco: ctx q 2-3 minutes  Labs: Lab Results  Component Value Date   WBC 7.1 11/01/2021   HGB 15.3 (H) 11/01/2021   HCT 46.2 (H) 11/01/2021   MCV 84.6 11/01/2021   PLT 155 11/01/2021    Patient Active Problem List   Diagnosis Date Noted   Encounter for induction of labor 11/01/2021   GBS (group B Streptococcus carrier), +RV culture, currently pregnant 10/13/2021   Language barrier 08/25/2021   Gestational diabetes mellitus (GDM) in third trimester 08/15/2021   Edema during pregnancy in second trimester 07/19/2021   Obesity in pregnancy 05/24/2021   Supervision of high risk pregnancy, antepartum 04/14/2021   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 G2P0010 at [redacted]w[redacted]d here for IOL d/t A2GDM  Labor: Pt has progressed well since last check and is now 8/90/-1 and on pit 20 ml/hr. Will check cervix again in about 3 hours or sooner if indicated Fetal Wellbeing:  cat 1 Pain Control:  epidrual Anticipated MOD:  SVD ID:  GBS pos > PCN  Precious Gilding PGY1, family medicine resident  11/02/2021, 12:27 PM

## 2021-11-02 NOTE — Progress Notes (Addendum)
LABOR PROGRESS NOTE  Sarah Koch is a 31 y.o. G2P0010 at [redacted]w[redacted]d  admitted for IOL d/t A2GDM  Subjective: Pt is feeling more pain with contractions and would like an epidural at this time.  Objective: BP 113/66    Pulse (!) 119    Temp 98.3 F (36.8 C) (Oral)    Resp 18    Ht 5' 2.99" (1.6 m)    Wt 93.9 kg    LMP 02/09/2021    BMI 36.68 kg/m  or  Vitals:   11/02/21 0647 11/02/21 0730 11/02/21 0804 11/02/21 0830  BP: 114/85 122/66 107/68 113/66  Pulse: (!) 107 (!) 107 (!) 102 (!) 119  Resp:  18 18   Temp:  98.3 F (36.8 C)    TempSrc:  Oral    Weight:      Height:        Dilation: 4.5 Effacement (%): 80 Station: -1, -2 Presentation: Vertex Exam by:: Dr. Ephriam Jenkins FHT: baseline rate 145, moderate varibility, accels present, no decels Toco: ctx q 1-3 minutes  Labs: Lab Results  Component Value Date   WBC 7.1 11/01/2021   HGB 15.3 (H) 11/01/2021   HCT 46.2 (H) 11/01/2021   MCV 84.6 11/01/2021   PLT 155 11/01/2021    Patient Active Problem List   Diagnosis Date Noted   Encounter for induction of labor 11/01/2021   GBS (group B Streptococcus carrier), +RV culture, currently pregnant 10/13/2021   Language barrier 08/25/2021   Gestational diabetes mellitus (GDM) in third trimester 08/15/2021   Edema during pregnancy in second trimester 07/19/2021   Obesity in pregnancy 05/24/2021   Supervision of high risk pregnancy, antepartum 04/14/2021   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 G2P0010 at [redacted]w[redacted]d here for IOL d/t A2GDM  Labor: Will wait for pt to get epidural before next cervical check. Pt is having regular painful contractions on pit of 20 ml/hr Fetal Wellbeing:  cat 1 Pain Control:  s/p multiple doses of fentanyl, planning for epidural  Anticipated MOD:  SVD ID: GBS pos > PCN  Erick Alley PGY1, family medicine resident  11/02/2021, 9:31 AM

## 2021-11-02 NOTE — Progress Notes (Signed)
Sarah Koch is a 31 y.o. G2P0010 at [redacted]w[redacted]d admitted for induction of labor due to Gestational diabetes.  [name] iPAD interpreter used.  Subjective: Reports feeling contractions are a bit stronger. Also feels some lower pelvic pressure. Reports she is coping well with her pain and at  this time doesn't want any medications and is not ready for her epidural yet.  Objective: BP 126/83    Pulse 97    Temp 98.2 F (36.8 C) (Axillary)    Resp 15    Ht 5' 2.99" (1.6 m)    Wt 93.9 kg    LMP 02/09/2021    BMI 36.68 kg/m  I/O last 3 completed shifts: In: 851.3 [I.V.:851.3] Out: -  No intake/output data recorded.  FHT:  FHR: 120 bpm, variability: moderate,  accelerations:  Present,  decelerations:  Present early with contractions UC:   irregular, has some every 1-2 min apart and then space out by 3-4 min. Coupling/tripling pattern.  SVE:   Dilation: 4 Effacement (%): 60 Station: -1 Exam by:: Dr. Ephriam Jenkins  Labs: Lab Results  Component Value Date   WBC 7.1 11/01/2021   HGB 15.3 (H) 11/01/2021   HCT 46.2 (H) 11/01/2021   MCV 84.6 11/01/2021   PLT 155 11/01/2021    Assessment / Plan: G2P0010 at [redacted]w[redacted]d admitted for induction of labor due to Gestational diabetes.  Labor:  Cervix changed but minimal since last check and AROM.  Offered IUPC for better titration of pitocin and contraction monitoring and patient agreeable. IUPC placed. Head in ROT position. Will plan for positional changed to facilitate further descent.   Fetal Wellbeing:  Category I Pain Control:  Labor support without medications, plans epidural  I/D:   GBS pos>PCN  #A2GDM BG within range. Continue q4hr checks.  Warner Mccreedy, MD, MPH OB Fellow, [facility]

## 2021-11-02 NOTE — Anesthesia Preprocedure Evaluation (Signed)
Anesthesia Evaluation  Patient identified by MRN, date of birth, ID band Patient awake    Reviewed: Allergy & Precautions, NPO status , Patient's Chart, lab work & pertinent test results  Airway Mallampati: II  TM Distance: >3 FB Neck ROM: Full    Dental no notable dental hx.    Pulmonary neg pulmonary ROS,    Pulmonary exam normal breath sounds clear to auscultation       Cardiovascular negative cardio ROS Normal cardiovascular exam Rhythm:Regular Rate:Normal     Neuro/Psych negative neurological ROS  negative psych ROS   GI/Hepatic negative GI ROS, Neg liver ROS,   Endo/Other  diabetes  Renal/GU negative Renal ROS  negative genitourinary   Musculoskeletal negative musculoskeletal ROS (+)   Abdominal (+) + obese,   Peds negative pediatric ROS (+)  Hematology negative hematology ROS (+)   Anesthesia Other Findings   Reproductive/Obstetrics (+) Pregnancy                             Anesthesia Physical Anesthesia Plan  ASA: 3  Anesthesia Plan: Epidural   Post-op Pain Management:    Induction:   PONV Risk Score and Plan:   Airway Management Planned:   Additional Equipment:   Intra-op Plan:   Post-operative Plan:   Informed Consent:   Plan Discussed with:   Anesthesia Plan Comments:         Anesthesia Quick Evaluation

## 2021-11-02 NOTE — Progress Notes (Signed)
LABOR PROGRESS NOTE  Sarah Koch is a 31 y.o. G2P0010 at [redacted]w[redacted]d  admitted for IOL d/t A2GDM  Subjective: Pt is doing well, does not feeling any pressure or need to push at this time.   Objective: BP 110/71    Pulse (!) 105    Temp 98.3 F (36.8 C) (Oral)    Resp 18    Ht 5' 2.99" (1.6 m)    Wt 93.9 kg    LMP 02/09/2021    BMI 36.68 kg/m  or  Vitals:   11/02/21 1631 11/02/21 1702 11/02/21 1731 11/02/21 1801  BP: 119/90 133/73 132/68 110/71  Pulse: (!) 106 98 (!) 106 (!) 105  Resp:   18   Temp:      TempSrc:      Weight:      Height:         Dilation: 10 Dilation Complete Date: 11/02/21 Dilation Complete Time: 1821 Effacement (%): 100 Station: 0 Presentation: Vertex Exam by:: Dr. Yetta Barre FHT: baseline rate 155, moderate varibility, accels present, no decels Toco: ctx q 3 minutes  Labs: Lab Results  Component Value Date   WBC 7.1 11/01/2021   HGB 15.3 (H) 11/01/2021   HCT 46.2 (H) 11/01/2021   MCV 84.6 11/01/2021   PLT 155 11/01/2021    Patient Active Problem List   Diagnosis Date Noted   Encounter for induction of labor 11/01/2021   GBS (group B Streptococcus carrier), +RV culture, currently pregnant 10/13/2021   Language barrier 08/25/2021   Gestational diabetes mellitus (GDM) in third trimester 08/15/2021   Edema during pregnancy in second trimester 07/19/2021   Obesity in pregnancy 05/24/2021   Supervision of high risk pregnancy, antepartum 04/14/2021   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 G2P0010 at [redacted]w[redacted]d here for IOL d/t A2GDM  Labor: Pt is complete at station 0 feeling no pressure/need to push. Will allow pt to labor down for 1 hour before checking again or sooner if pt feels pressure.  Fetal Wellbeing:  cat 1 Pain Control:  epidural Anticipated MOD:  SVD  Erick Alley PGY1, family medicine resident  11/02/2021, 6:28 PM

## 2021-11-02 NOTE — Anesthesia Procedure Notes (Signed)
Epidural Patient location during procedure: OB Start time: 11/02/2021 9:45 AM End time: 11/02/2021 10:02 AM  Staffing Anesthesiologist: Lynda Rainwater, MD Performed: anesthesiologist   Preanesthetic Checklist Completed: patient identified, IV checked, site marked, risks and benefits discussed, surgical consent, monitors and equipment checked, pre-op evaluation and timeout performed  Epidural Patient position: sitting Prep: ChloraPrep Patient monitoring: heart rate, cardiac monitor, continuous pulse ox and blood pressure Approach: midline Location: L2-L3 Injection technique: LOR saline  Needle:  Needle type: Tuohy  Needle gauge: 17 G Needle length: 9 cm Needle insertion depth: 6 cm Catheter type: closed end flexible Catheter size: 20 Guage Catheter at skin depth: 10 cm Test dose: negative  Assessment Events: blood not aspirated, injection not painful, no injection resistance, no paresthesia and negative IV test  Additional Notes Reason for block:procedure for pain

## 2021-11-02 NOTE — Progress Notes (Signed)
Pharmacy Antibiotic Note  Jalea Bronaugh is a 31 y.o. female admitted on 11/01/2021 .  Pharmacy has G2P0010 at [redacted]w[redacted]d  admitted for IOL d/t A2GDM. Pharmacy has been consulted for Gentamicin dosing for Triple I.   Plan: Gentamicin 350mg  (5mg /kg) IV q24h Will continue to follow  Height: 5' 2.99" (160 cm) Weight: 93.9 kg (207 lb) IBW/kg (Calculated) : 52.38 Adjusted/Dosing weight: 69kg  Temp (24hrs), Avg:98.4 F (36.9 C), Min:97.6 F (36.4 C), Max:100.7 F (38.2 C)  Recent Labs  Lab 11/01/21 0828  WBC 7.1  CREATININE 0.57    Estimated Creatinine Clearance: 114 mL/min (by C-G formula based on SCr of 0.57 mg/dL).    No Known Allergies  Antimicrobials this admission: Penicillin protocol for GBS+  2/14- 2/15 Ampicillin 2 gram IV q6h  2/15 >>    Thank you for allowing pharmacy to be a part of this patients care.  04-01-1992 11/02/2021 9:44 PM

## 2021-11-02 NOTE — Progress Notes (Signed)
LABOR PROGRESS NOTE  Sarah Koch is a 31 y.o. G2P0010 at [redacted]w[redacted]d  admitted for IOL d/t A2GDM  Subjective: Pt is doing well, feeling a little pressure occasionally, no concerns at this time.   Objective: BP 125/62    Pulse (!) 119    Temp 98.2 F (36.8 C) (Oral)    Resp 18    Ht 5' 2.99" (1.6 m)    Wt 93.9 kg    LMP 02/09/2021    BMI 36.68 kg/m  or  Vitals:   11/02/21 1431 11/02/21 1501 11/02/21 1543 11/02/21 1601  BP: 127/85 (!) 143/86 109/62 125/62  Pulse: 100 (!) 125 (!) 103 (!) 119  Resp:   18   Temp:      TempSrc:      Weight:      Height:         Dilation: 9 Effacement (%): 90 Station: -1 Presentation: Vertex Exam by:: Dr. Yetta Barre FHT: baseline rate 140, moderate varibility, accels present, no decels Toco: ctx q 2-5 minutes  Labs: Lab Results  Component Value Date   WBC 7.1 11/01/2021   HGB 15.3 (H) 11/01/2021   HCT 46.2 (H) 11/01/2021   MCV 84.6 11/01/2021   PLT 155 11/01/2021    Patient Active Problem List   Diagnosis Date Noted   Encounter for induction of labor 11/01/2021   GBS (group B Streptococcus carrier), +RV culture, currently pregnant 10/13/2021   Language barrier 08/25/2021   Gestational diabetes mellitus (GDM) in third trimester 08/15/2021   Edema during pregnancy in second trimester 07/19/2021   Obesity in pregnancy 05/24/2021   Supervision of high risk pregnancy, antepartum 04/14/2021   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 G2P0010 at [redacted]w[redacted]d here for IOL d/t A2GDM  Labor: Ctx not adequate (< 200 MVU), currently on pit of 26 ml/hr, will continue to titrate up as tolerated Fetal Wellbeing:  cat 1 Pain Control:  epidural Anticipated MOD:  SVD ID: GBS pos > PCN  Erick Alley PGY1, family medicine resident  11/02/2021, 4:21 PM

## 2021-11-02 NOTE — Discharge Summary (Signed)
Postpartum Discharge Summary     Patient Name: Sarah Koch DOB: 12/15/1990 MRN: [id number]  Date of admission: 11/01/2021 Delivery date:11/02/2021  Delivering provider: Renard Matter  Date of discharge: 11/04/2021  Admitting diagnosis: Encounter for induction of labor [Z34.90] Intrauterine pregnancy: [redacted]w[redacted]d    Secondary diagnosis:  Principal Problem:   Encounter for induction of labor Active Problems:   Supervision of high risk pregnancy, antepartum   Obesity in pregnancy   Edema during pregnancy in second trimester   Gestational diabetes mellitus (GDM) in third trimester   Language barrier   GBS (group B Streptococcus carrier), +RV culture, currently pregnant   Third degree laceration of perineum, type 3a   Vaginal delivery  Additional problems: None    Discharge diagnosis: Term Pregnancy Delivered and GDM A2                                              Post partum procedures: None Augmentation: AROM, Pitocin, Cytotec, and IP Foley Complications: Intrauterine Inflammation or infection (Chorioamniotis)  Hospital course: Induction of Labor With Vaginal Delivery   31y.o. yo G2P0010 at 329w1das admitted to the hospital 11/01/2021 for induction of labor.  Indication for induction: A2 DM.  Patient had an uncomplicated labor course as follows: Membrane Rupture Time/Date: 9:26 PM ,11/01/2021   Delivery Method:Vaginal, Spontaneous  Episiotomy: None  Lacerations:  Periurethral;3rd degree  Details of delivery can be found in separate delivery note.  Patient had a routine postpartum course. Patient is discharged home 11/04/21.  Newborn Data: Birth date:11/02/2021  Birth time:9:16 PM  Gender:Female  Living status:Living  Apgars:9 ,9  Weight:3660 g   Magnesium Sulfate received: No BMZ received: No Rhophylac:N/A MMR:N/A T-DaP:declined Flu: declined Transfusion:No  Physical exam  Vitals:   11/03/21 0446 11/03/21 0935 11/03/21 1415 11/03/21 2311  BP: (!) 108/59 110/84 112/74  111/64  Pulse: (!) 110 (!) 103 (!) 102 95  Resp: 16 16 16 18   Temp: 98.3 F (36.8 C) 97.9 F (36.6 C) 98.4 F (36.9 C) (!) 97.4 F (36.3 C)  TempSrc: Oral Oral Oral Oral  SpO2: 100% 100% 100% 100%  Weight:      Height:       General: alert Lochia: appropriate Uterine Fundus: firm Incision: N/A DVT Evaluation: No evidence of DVT seen on physical exam. Labs: Lab Results  Component Value Date   WBC 7.1 11/01/2021   HGB 15.3 (H) 11/01/2021   HCT 46.2 (H) 11/01/2021   MCV 84.6 11/01/2021   PLT 155 11/01/2021   CMP Latest Ref Rng & Units 11/01/2021  Glucose 70 - 99 mg/dL 108(H)  BUN 6 - 20 mg/dL 5(L)  Creatinine 0.44 - 1.00 mg/dL 0.57  Sodium 135 - 145 mmol/L 135  Potassium 3.5 - 5.1 mmol/L 3.9  Chloride 98 - 111 mmol/L 103  CO2 22 - 32 mmol/L 22  Calcium 8.9 - 10.3 mg/dL 10.4(H)  Total Protein 6.5 - 8.1 g/dL 6.6  Total Bilirubin 0.3 - 1.2 mg/dL 0.6  Alkaline Phos 38 - 126 U/L 132(H)  AST 15 - 41 U/L 33  ALT 0 - 44 U/L 18   Edinburgh Score: No flowsheet data found.   After visit meds:  Allergies as of 11/04/2021   No Known Allergies      Medication List     STOP taking these medications    aspirin  EC 81 MG tablet   metFORMIN 500 MG tablet Commonly known as: GLUCOPHAGE       TAKE these medications    Accu-Chek Guide test strip Generic drug: glucose blood Use to check blood sugars four times a day was instructed   Accu-Chek Guide w/Device Kit 1 Device by Does not apply route 4 (four) times daily.   Accu-Chek Softclix Lancets lancets Use as instructed   acetaminophen 325 MG tablet Commonly known as: Tylenol Take 2 tablets (650 mg total) by mouth every 4 (four) hours as needed (for pain scale < 4).   ferrous sulfate 324 MG Tbec Take 324 mg by mouth.   ibuprofen 600 MG tablet Commonly known as: ADVIL Take 1 tablet (600 mg total) by mouth every 6 (six) hours.   polyethylene glycol 17 g packet Commonly known as: MIRALAX / GLYCOLAX Take 17 g  by mouth daily.   PrePLUS 27-1 MG Tabs Take 1 tablet by mouth daily.         Discharge home in stable condition Infant Feeding: Breast Infant Disposition:home with mother Discharge instruction: per After Visit Summary and Postpartum booklet. Activity: Advance as tolerated. Pelvic rest for 6 weeks.  Diet: routine diet Future Appointments: Future Appointments  Date Time Provider [place]  12/14/2021  9:00 AM [facility] None  12/14/2021 10:55 AM Patriciaann Clan, DO [facility] None   Follow up Visit: Message sent to [facility] by Dr. Cy Blamer on 2/15  Please schedule this patient for a In person postpartum visit in 4 weeks with the following provider: Any provider. Additional Postpartum F/U:2 hour GTT and 1 week bottom check for third degree laceration. High risk pregnancy complicated by: GDM Delivery mode:  Vaginal, Spontaneous  Anticipated Birth Control:   declines  Renard Matter, MD, MPH OB Fellow, [facility]

## 2021-11-03 ENCOUNTER — Encounter (HOSPITAL_COMMUNITY): Payer: Self-pay | Admitting: Obstetrics and Gynecology

## 2021-11-03 LAB — GLUCOSE, CAPILLARY: Glucose-Capillary: 122 mg/dL — ABNORMAL HIGH (ref 70–99)

## 2021-11-03 MED ORDER — PRENATAL MULTIVITAMIN CH
1.0000 | ORAL_TABLET | Freq: Every day | ORAL | Status: DC
  Administered 2021-11-03 – 2021-11-04 (×2): 1 via ORAL
  Filled 2021-11-03 (×2): qty 1

## 2021-11-03 MED ORDER — MEASLES, MUMPS & RUBELLA VAC IJ SOLR: 0.5000 mL | Freq: Once | INTRAMUSCULAR | Status: DC

## 2021-11-03 MED ORDER — ONDANSETRON HCL 4 MG PO TABS: 4.0000 mg | ORAL_TABLET | ORAL | Status: DC | PRN

## 2021-11-03 MED ORDER — SIMETHICONE 80 MG PO CHEW: 80.0000 mg | CHEWABLE_TABLET | ORAL | Status: DC | PRN

## 2021-11-03 MED ORDER — WITCH HAZEL-GLYCERIN EX PADS: 1.0000 "application " | MEDICATED_PAD | CUTANEOUS | Status: DC | PRN

## 2021-11-03 MED ORDER — TETANUS-DIPHTH-ACELL PERTUSSIS 5-2.5-18.5 LF-MCG/0.5 IM SUSY: 0.5000 mL | PREFILLED_SYRINGE | Freq: Once | INTRAMUSCULAR | Status: DC

## 2021-11-03 MED ORDER — POLYETHYLENE GLYCOL 3350 17 G PO PACK
17.0000 g | PACK | Freq: Every day | ORAL | Status: DC
  Administered 2021-11-03 – 2021-11-04 (×2): 17 g via ORAL
  Filled 2021-11-03 (×2): qty 1

## 2021-11-03 MED ORDER — ONDANSETRON HCL 4 MG/2ML IJ SOLN: 4.0000 mg | INTRAMUSCULAR | Status: DC | PRN

## 2021-11-03 MED ORDER — IBUPROFEN 600 MG PO TABS
600.0000 mg | ORAL_TABLET | Freq: Four times a day (QID) | ORAL | Status: DC
  Administered 2021-11-03 – 2021-11-04 (×7): 600 mg via ORAL
  Filled 2021-11-03 (×7): qty 1

## 2021-11-03 MED ORDER — SENNOSIDES-DOCUSATE SODIUM 8.6-50 MG PO TABS
2.0000 | ORAL_TABLET | Freq: Every day | ORAL | Status: DC
  Administered 2021-11-03 – 2021-11-04 (×2): 2 via ORAL
  Filled 2021-11-03 (×2): qty 2

## 2021-11-03 MED ORDER — DIPHENHYDRAMINE HCL 25 MG PO CAPS: 25.0000 mg | ORAL_CAPSULE | Freq: Four times a day (QID) | ORAL | Status: DC | PRN

## 2021-11-03 MED ORDER — ACETAMINOPHEN 325 MG PO TABS
650.0000 mg | ORAL_TABLET | ORAL | Status: DC | PRN
  Administered 2021-11-03 (×2): 650 mg via ORAL
  Filled 2021-11-03 (×2): qty 2

## 2021-11-03 MED ORDER — COCONUT OIL OIL
1.0000 "application " | TOPICAL_OIL | Status: DC | PRN
  Administered 2021-11-03: 1 via TOPICAL

## 2021-11-03 MED ORDER — MEDROXYPROGESTERONE ACETATE 150 MG/ML IM SUSP: 150.0000 mg | INTRAMUSCULAR | Status: DC | PRN

## 2021-11-03 MED ORDER — DIBUCAINE (PERIANAL) 1 % EX OINT: 1.0000 "application " | TOPICAL_OINTMENT | CUTANEOUS | Status: DC | PRN

## 2021-11-03 MED ORDER — BENZOCAINE-MENTHOL 20-0.5 % EX AERO
1.0000 "application " | INHALATION_SPRAY | CUTANEOUS | Status: DC | PRN
  Administered 2021-11-03: 1 via TOPICAL
  Filled 2021-11-03: qty 56

## 2021-11-03 NOTE — Lactation Note (Signed)
This note was copied from a baby's chart. Lactation Consultation Note  Patient Name: Sarah Koch RSWNI'O Date: 11/03/2021 Reason for consult: Follow-up assessment;1st time breastfeeding;Primapara;Term Age:29 hours   P1 mother whose infant is now 57 hours old.  This is a term baby at 39+1 weeks.  Mother's current feeding preference is breast/formula.  Tigrinian interpreter, [place] [phone]) used for interpretation.  Baby was swaddled and asleep in the bassinet when I arrived.  Mother has been formula feeding only.  She was given breast shells and a NS last night by the night shift LC and has not used either tool.  Mother stated that baby will not latch.  She formula fed < 1 hour ago.  Suggested mother call her RN/LC for latch assistance with the next feeding.  Explained the importance of breast stimulation with frequent feedings and pumping to help ensure a good milk supply.  Mother has only pumped twice because she wasn't "getting anything."  Emphasized that volume is not likely at this time, however, it is imperative that she be pumping every three hours, especially if she is not latching.  Mother verbalized understanding.  Asked mother if she was willing to pump now and she replied, "Yes."  Offered to help her with the DEBP, however, she politely declined and stated that she knew how to use it.  She is planning on buying a DEBP for home use and has also spoken to a provider this morning regarding obtaining WIC and a WIC pump.  Female support person at bedside.  RN updated.   Maternal Data Has patient been taught Hand Expression?: Yes Does the patient have breastfeeding experience prior to this delivery?: No  Feeding Mother's Current Feeding Choice: Breast Milk and Formula  LATCH Score                    Lactation Tools Discussed/Used    Interventions Interventions: Breast feeding basics reviewed;Education  Discharge Pump: DEBP;Manual WIC Program: No (Mother  reported that a provider came in earlier today and will be following up with [facility] information)  Consult Status Consult Status: Follow-up Date: 11/04/21 Follow-up type: In-patient    Dora Sims 11/03/2021, 3:40 PM

## 2021-11-03 NOTE — Progress Notes (Signed)
POSTPARTUM PROGRESS NOTE  Subjective: Sarah Koch is a 31 y.o. [id number] PPD#1 s/p SVD at [redacted]w[redacted]d.  She reports she doing well. No acute events overnight. She denies any problems with ambulating, voiding or po intake. Denies nausea or vomiting. She has  passed flatus. Pain is well controlled.  Lochia is scant.  Objective: Blood pressure (!) 108/59, pulse (!) 110, temperature 98.3 F (36.8 C), temperature source Oral, resp. rate 16, height 5' 2.99" (1.6 m), weight 93.9 kg, last menstrual period 02/09/2021, SpO2 100 %, unknown if currently breastfeeding.  Physical Exam:  General: alert, cooperative and no distress Chest: no respiratory distress Abdomen: soft, non-tender  Uterine Fundus: firm, appropriately tender Extremities: No calf swelling or tenderness   trace edema  Recent Labs    11/01/21 0828  HGB 15.3*  HCT 46.2*    Assessment/Plan: Sarah Koch is a 31 y.o. [id number] PPD#1 s/p SVD at [redacted]w[redacted]d  Routine Postpartum Care: Doing well, pain well-controlled.  -- Continue routine care, lactation support  -- Contraception: declines -- Feeding: breast  #A2GDM Patient had PPD#1 BG of 121 but patient had food around 2 AM. Will check PPD#2 fasting blood glucose.  Dispo: Plan for discharge plans to discharge on PPD#2 as late evening delivery.  Renard Matter, MD, MPH OB Fellow, [facility] for [facility]

## 2021-11-03 NOTE — Anesthesia Postprocedure Evaluation (Signed)
Anesthesia Post Note  Patient: Kamika Goodloe  Procedure(s) Performed: AN AD HOC LABOR EPIDURAL     Patient location during evaluation: Mother Baby Anesthesia Type: Epidural Level of consciousness: awake and alert Pain management: pain level controlled Vital Signs Assessment: post-procedure vital signs reviewed and stable Respiratory status: spontaneous breathing, nonlabored ventilation and respiratory function stable Cardiovascular status: stable Postop Assessment: no headache, no backache and epidural receding Anesthetic complications: no   No notable events documented.  Last Vitals:  Vitals:   11/03/21 0935 11/03/21 1415  BP: 110/84 112/74  Pulse: (!) 103 (!) 102  Resp: 16 16  Temp: 36.6 C 36.9 C  SpO2: 100% 100%    Last Pain:  Vitals:   11/03/21 1415  TempSrc: Oral  PainSc: 0-No pain   Pain Goal: Patients Stated Pain Goal: 0 (11/02/21 0430)                 Garlan Drewes

## 2021-11-03 NOTE — Lactation Note (Signed)
This note was copied from a baby's chart. Lactation Consultation Note Used interpreter Marvene Staff [phone] Mom told LC in English that she had tried to BF but baby wouldn't latch. Mom tried w/wo NS. Mom has edema to breast. Breast non-compressible and flat.  Baby took 28 ml formula. Mom has pumped w/nothing noted. Noted baby jittery. Mom asked why is her chin jittery and her arms jump when she talks. Noted baby arms/hands jittery and noted chin jittery as well. Baby is swaddled and room warm. Reported to RN. Baby's TCB level high. RN calling MD and will get glucose serum w/bili serum.  Patient Name: Sarah Koch VZCHY'I Date: 11/03/2021 Reason for consult: Follow-up assessment;Difficult latch;Primapara;Term Age:97 hours  Maternal Data    Feeding Nipple Type: Nfant Slow Flow (purple)  LATCH Score Latch: Too sleepy or reluctant, no latch achieved, no sucking elicited.  Audible Swallowing: None  Type of Nipple: Flat  Comfort (Breast/Nipple): Filling, red/small blisters or bruises, mild/mod discomfort (edema)  Hold (Positioning): No assistance needed to correctly position infant at breast.  LATCH Score: 4   Lactation Tools Discussed/Used    Interventions Interventions: Breast feeding basics reviewed  Discharge    Consult Status Consult Status: Follow-up Date: 11/04/21 Follow-up type: In-patient    Charyl Dancer 11/03/2021, 10:25 PM

## 2021-11-03 NOTE — Plan of Care (Signed)
Problem: Education: Goal: Knowledge of Childbirth will improve Outcome: Completed/Met Goal: Ability to make informed decisions regarding treatment and plan of care will improve Outcome: Completed/Met Goal: Ability to state and carry out methods to decrease the pain will improve Outcome: Completed/Met Goal: Individualized Educational Video(s) Outcome: Completed/Met   Problem: Coping: Goal: Ability to verbalize concerns and feelings about labor and delivery will improve Outcome: Completed/Met   Problem: Life Cycle: Goal: Ability to make normal progression through stages of labor will improve Outcome: Completed/Met Goal: Ability to effectively push during vaginal delivery will improve Outcome: Completed/Met   Problem: Role Relationship: Goal: Will demonstrate positive interactions with the child Outcome: Completed/Met   Problem: Safety: Goal: Risk of complications during labor and delivery will decrease Outcome: Completed/Met   Problem: Pain Management: Goal: Relief or control of pain from uterine contractions will improve Outcome: Completed/Met   Problem: Education: Goal: Knowledge of General Education information will improve Description: Including pain rating scale, medication(s)/side effects and non-pharmacologic comfort measures Outcome: Completed/Met   Problem: Health Behavior/Discharge Planning: Goal: Ability to manage health-related needs will improve Outcome: Completed/Met   Problem: Clinical Measurements: Goal: Ability to maintain clinical measurements within normal limits will improve Outcome: Completed/Met Goal: Will remain free from infection Outcome: Completed/Met Goal: Diagnostic test results will improve Outcome: Completed/Met Goal: Respiratory complications will improve Outcome: Completed/Met Goal: Cardiovascular complication will be avoided Outcome: Completed/Met   Problem: Activity: Goal: Risk for activity intolerance will decrease Outcome:  Completed/Met   Problem: Nutrition: Goal: Adequate nutrition will be maintained Outcome: Completed/Met   Problem: Coping: Goal: Level of anxiety will decrease Outcome: Completed/Met   Problem: Elimination: Goal: Will not experience complications related to bowel motility Outcome: Completed/Met Goal: Will not experience complications related to urinary retention Outcome: Completed/Met   Problem: Pain Managment: Goal: General experience of comfort will improve Outcome: Completed/Met   Problem: Safety: Goal: Ability to remain free from injury will improve Outcome: Completed/Met   Problem: Skin Integrity: Goal: Risk for impaired skin integrity will decrease Outcome: Completed/Met   Problem: Education: Goal: Ability to describe self-care measures that may prevent or decrease complications (Diabetes Survival Skills Education) will improve Outcome: Completed/Met Goal: Individualized Educational Video(s) Outcome: Completed/Met   Problem: Coping: Goal: Ability to adjust to condition or change in health will improve Outcome: Completed/Met   Problem: Fluid Volume: Goal: Ability to maintain a balanced intake and output will improve Outcome: Completed/Met   Problem: Health Behavior/Discharge Planning: Goal: Ability to identify and utilize available resources and services will improve Outcome: Completed/Met Goal: Ability to manage health-related needs will improve Outcome: Completed/Met   Problem: Metabolic: Goal: Ability to maintain appropriate glucose levels will improve Outcome: Completed/Met   Problem: Nutritional: Goal: Maintenance of adequate nutrition will improve Outcome: Completed/Met Goal: Progress toward achieving an optimal weight will improve Outcome: Completed/Met   Problem: Skin Integrity: Goal: Risk for impaired skin integrity will decrease Outcome: Completed/Met   Problem: Tissue Perfusion: Goal: Adequacy of tissue perfusion will improve Outcome:  Completed/Met   

## 2021-11-03 NOTE — Lactation Note (Signed)
This note was copied from a baby's chart. Lactation Consultation Note Used interpreter [phone] [name] and [name] speak very little English. Mom has edema to breast. Nipples non-compressible and flat. Attempted to latch to breast but baby unable to latch. Fitted mom w/#24 NS d/t baby latched but didn't suck at all. It is going to be challenging d/t language barrier getting mom to firm breast and latch baby in football position. Support, positioning, STS, I&O, pre-pumping may help define nipples better. Hopefully if mom has swelling to go down she can latch baby w/o NS but not at this time. Mom was GDM so concerned over baby getting hypoglycemia. Discussed mom needing to supplement until she can see transfer of milk from breast to baby. Discussed pumping. Mom agreeable. Mom shown how to use DEBP & how to disassemble, clean, & reassemble parts. Mom knows to pump q3h for 15-20 min. Mom encouraged to feed baby 8-12 times/24 hours and with feeding cues.   LC taught mom hand expression w/a few drops noted. Breast tender. Shells given to wear in am. Encouraged mom to call for assistance. Lactation brochure given.  Patient Name: Sarah Koch ATFTD'D Date: 11/03/2021 Reason for consult: Initial assessment;Term;Maternal endocrine disorder;Primapara Age:66 hours  Maternal Data Has patient been taught Hand Expression?: Yes Does the patient have breastfeeding experience prior to this delivery?: No  Feeding Mother's Current Feeding Choice: Breast Milk and Formula  LATCH Score Latch: Too sleepy or reluctant, no latch achieved, no sucking elicited.  Audible Swallowing: None  Type of Nipple: Flat  Comfort (Breast/Nipple): Filling, red/small blisters or bruises, mild/mod discomfort (edema)  Hold (Positioning): Full assist, staff holds infant at breast  LATCH Score: 2   Lactation Tools Discussed/Used    Interventions Interventions: Breast feeding basics reviewed;Assisted with latch;Skin  to skin;Breast massage;Hand express;Pre-pump if needed;Reverse pressure;Breast compression;Adjust position;Support pillows;Position options;Expressed milk;Shells;DEBP  Discharge    Consult Status Consult Status: Follow-up Date: 11/03/21 Follow-up type: In-patient    [name], Diamond Nickel 11/03/2021, 3:15 AM

## 2021-11-04 LAB — GLUCOSE, CAPILLARY: Glucose-Capillary: 92 mg/dL (ref 70–99)

## 2021-11-04 MED ORDER — POLYETHYLENE GLYCOL 3350 17 G PO PACK: 17.0000 g | PACK | Freq: Every day | ORAL | 0 refills | Status: DC

## 2021-11-04 MED ORDER — ACETAMINOPHEN 325 MG PO TABS: 650.0000 mg | ORAL_TABLET | ORAL | 0 refills | Status: AC | PRN

## 2021-11-04 MED ORDER — IBUPROFEN 600 MG PO TABS: 600.0000 mg | ORAL_TABLET | Freq: Four times a day (QID) | ORAL | 0 refills | Status: DC

## 2021-11-04 NOTE — Lactation Note (Signed)
This note was copied from a baby's chart. Lactation Consultation Note  Patient Name: Sarah Koch Date: 11/04/2021 Reason for consult: Follow-up assessment;1st time breastfeeding;Primapara;Term Age:30 hours   P1 mother whose infant is now 70 hours old.  This is a term baby at 39+1 weeks.  Mother's current feeding preference is breast/formula.  Tigrnian interpreter, Tesh [phone]) used for interpretation.  Mother was formula feeding when I arrived.  She has not been breast feeding or pumping.  Comprehensive amount of education completed yesterday discussing the importance of latching and pumping.  Reviewed this again today.  Offered to awaken baby and assist with latching; she has only consumed 10 mls of formula prior to my arrival.  Mother receptive.  Reviewed hand expression; no drops obtained.  Baby was clothed and had a blanket swaddle on; very sleepy.  Discussed the importance of removing blanket and clothing and feeding STS.  Awakened and assisted to latch in the football hold.  Baby latched, however, showed no interest in initiating a suck.  She fell asleep.  Demonstrated breast compressions and gentle stimulation without success with latching.  Placed her STS and she remained asleep.  Suggested mother call her RN/LC for latch assistance at the next feeding; suggested she refrain from providing formula until after attempting breast feeding.  Mother will continue to require much education with breast feeding.  Worked with positioning, breast support and body alignment.  Encouragement given.  RN updated.   Maternal Data Has patient been taught Hand Expression?: Yes  Feeding Mother's Current Feeding Choice: Breast Milk and Formula  LATCH Score Latch: Too sleepy or reluctant, no latch achieved, no sucking elicited.  Audible Swallowing: None  Type of Nipple: Everted at rest and after stimulation (Short shafted)  Comfort (Breast/Nipple): Soft / non-tender  Hold  (Positioning): Assistance needed to correctly position infant at breast and maintain latch.  LATCH Score: 5   Lactation Tools Discussed/Used Tools: Flanges;Pump Flange Size: 24 Breast pump type: Double-Electric Breast Pump;Manual Pump Education: Setup, frequency, and cleaning (Mother did not wish to review) Reason for Pumping: Breast stimulation for supplementation Pumping frequency: Every three hours Pumped volume: 0 mL  Interventions Interventions: Breast feeding basics reviewed;Assisted with latch;Skin to skin;Breast massage;Hand express;Breast compression;Adjust position;DEBP;Hand pump;Position options;Support pillows;Education  Discharge Pump: Personal (Mother plans to purchase a pump if needed) WIC Program: No  Consult Status Consult Status: Follow-up Date: 11/05/21 Follow-up type: In-patient    Dora Sims 11/04/2021, 12:14 PM

## 2021-11-10 ENCOUNTER — Ambulatory Visit: Payer: Medicaid Other | Admitting: Women's Health

## 2021-11-10 ENCOUNTER — Telehealth: Payer: Self-pay

## 2021-11-10 NOTE — Telephone Encounter (Signed)
No answer to TC x 4 With the help of Tigrinian Interpreter VMM was left to contact us regarding her Paperwork, we additional information.

## 2021-11-11 ENCOUNTER — Telehealth (HOSPITAL_COMMUNITY): Payer: Self-pay

## 2021-11-11 NOTE — Telephone Encounter (Signed)
No answer. No voicemail option.   Sarah Koch Sarah Koch [facility] 11/11/2021,1922

## 2021-11-30 ENCOUNTER — Ambulatory Visit (INDEPENDENT_AMBULATORY_CARE_PROVIDER_SITE_OTHER): Payer: Medicaid Other

## 2021-11-30 ENCOUNTER — Other Ambulatory Visit: Payer: Self-pay

## 2021-11-30 NOTE — Progress Notes (Signed)
? ?  POSTOPERATIVE VISIT NOTE  ? ?Subjective:  ?  ? Sarah Koch is a 31 y.o. G2P1011 who presents to [facility] for evaluation of perineal laceration. Patient is s/p SVD on 11/02/2021. Had 3rd degree laceration. She reports no pain, bleeding, purulent discharge, fever/chills, or other signs of infection. She is having normal BM's. She reports mild pressure to the area only when she is sitting on the toilet.  ? ?The following portions of the patient's history were reviewed and updated as appropriate: allergies, current medications, past family history, past medical history, past social history, past surgical history, and problem list. ? ?Review of Systems ?Pertinent items are noted in HPI.  ?  ?Objective:  ? ? BP 122/80   Pulse 97   Wt 189 lb (85.7 kg)   LMP 02/09/2021   Breastfeeding Yes   BMI 33.49 kg/m?  ?General:  alert, cooperative, and no distress  ?Abdomen: soft, non-tender  ?Perineum:   healing well, no drainage, no erythema  ?  ?  ?Assessment:   ?Postpartum ?Normal perineal healing ? ?  ?Plan:   ?- Continue any current medications ?- Refrain for intercourse until evaluated at [facility] visit ?- PP visit and GTT scheduled on 3/29 ?- Tigrinian interpretor used for today's visit ? ? ?Camelia Eng, CNM ?11/30/21 ?5:01 PM ? ? ?

## 2021-11-30 NOTE — Progress Notes (Signed)
31 y.o PP presents for laceration check ?

## 2021-12-14 ENCOUNTER — Other Ambulatory Visit: Payer: Self-pay

## 2021-12-14 ENCOUNTER — Other Ambulatory Visit: Payer: Medicaid Other

## 2021-12-14 ENCOUNTER — Other Ambulatory Visit (HOSPITAL_COMMUNITY)
Admission: RE | Admit: 2021-12-14 | Discharge: 2021-12-14 | Disposition: A | Discharge status: Home / Self Care | Payer: Medicaid Other | Source: Ambulatory Visit | Attending: Family Medicine | Admitting: Family Medicine

## 2021-12-14 ENCOUNTER — Encounter: Payer: Self-pay | Admitting: Family Medicine

## 2021-12-14 ENCOUNTER — Ambulatory Visit (INDEPENDENT_AMBULATORY_CARE_PROVIDER_SITE_OTHER): Payer: Medicaid Other | Admitting: Family Medicine

## 2021-12-14 DIAGNOSIS — Z1272 Encounter for screening for malignant neoplasm of vagina: Secondary | ICD-10-CM | POA: Insufficient documentation

## 2021-12-14 NOTE — Progress Notes (Signed)
Patient presents for PP visit. Patient is breastfeeding. Patient complains of having increased back pain after delivery. Patient has no other concerns. ?

## 2021-12-14 NOTE — Progress Notes (Signed)
? ? ?Post Partum Visit Note ? ?Sarah Koch is a 31 y.o. G63P1011 female who presents for a postpartum visit. She is 6 weeks postpartum following a normal spontaneous vaginal delivery.  I have fully reviewed the prenatal and intrapartum course. The delivery was at 39 gestational weeks.  Anesthesia: epidural. Postpartum course has been uncomplicated. Baby is doing well. Baby is feeding by breast. Bleeding no bleeding. Bowel function is normal. Bladder function is normal. Patient is not sexually active. Contraception method is none. Postpartum depression screening: negative. ? ?She reports back discomfort, like her middle/low back "is separating". States it is not pain but just bothersome. Started after delivery, getting somewhat better. No associated weakness, numbness, bowel/bladder incontinence. Walking as normal. Hasn't tried anything.  ? ?The pregnancy intention screening data noted above was reviewed. Potential methods of contraception were discussed. The patient elected to proceed with No data recorded. ? ? Edinburgh Postnatal Depression Scale - 12/14/21 1117   ? ?  ? Edinburgh Postnatal Depression Scale:  In the Past 7 Days  ? I have been able to laugh and see the funny side of things. 0   ? I have looked forward with enjoyment to things. 0   ? I have blamed myself unnecessarily when things went wrong. 0   ? I have been anxious or worried for no good reason. 0   ? I have felt scared or panicky for no good reason. 0   ? Things have been getting on top of me. 0   ? I have been so unhappy that I have had difficulty sleeping. 0   ? I have felt sad or miserable. 0   ? I have been so unhappy that I have been crying. 0   ? The thought of harming myself has occurred to me. 0   ? Edinburgh Postnatal Depression Scale Total 0   ? ?  ?  ? ?  ? ? ?Health Maintenance Due  ?Topic Date Due  ? COVID-19 Vaccine (1) Never done  ? URINE MICROALBUMIN  Never done  ? PAP-Cervical Cytology Screening  Never done  ? PAP SMEAR-Modifier   Never done  ? INFLUENZA VACCINE  04/18/2021  ? ? ?Review of Systems ?Pertinent items are noted in HPI. ? ?Objective:  ?BP 114/81   Pulse 80   Wt 185 lb 9.6 oz (84.2 kg)   LMP 02/09/2021   Breastfeeding Yes   BMI 32.89 kg/m?   ? ?General:  alert, cooperative, no distress, and mild distress  ? Breasts:  not indicated  ?Lungs: Normal WOB  ?Heart:  Regular rate   ?Abdomen: Soft, non-tender    ?MSK Lumbar/thoracic non-tender to palpation without any erythema, bruising or swelling. 5/5 LE strength with normal gait. Sensation to light touch intact.   ? Pelvic exam: VULVA: normal appearing vulva with no masses--perineal laceration appears to be healing well, now just has one small area that is almost healed in her left perineal region without any significant erythema or pus like drainage, VAGINA: normal appearing vagina with normal color and discharge, no lesions, CERVIX: normal appearing cervix without discharge or lesions. Pap performed with chaperone.   ?     ?Assessment:  ? ?Normal postpartum exam.  ? ?Plan:  ? ?Essential components of care per ACOG recommendations: ? ?1.  Mood and well being: Patient with negative depression screening today. Reviewed local resources for support.  ?- Patient tobacco use? No.   ?- hx of drug use? No.   ? ?2.  Infant care and feeding:  ?-Patient currently breastmilk feeding? Yes ?-Social determinants of health (SDOH) reviewed in EPIC. No concerns. ? ?3. Sexuality, contraception and birth spacing ?- Patient does not want a pregnancy in the next year.  Desired family size is undecided.  ?- Reviewed reproductive life planning. Reviewed contraceptive methods based on pt preferences and effectiveness.  Patient declines contraception today but is aware of her options.   ?- Discussed birth spacing of 18 months ? ?4. Sleep and fatigue ?-Encouraged family/partner/community support of 4 hrs of uninterrupted sleep to help with mood and fatigue ? ?5. Physical Recovery  ?- Discussed patients  delivery and complications. She describes her labor as good. ?- Patient had a Vaginal problems after delivery including 3a laceration . Perineal healing reviewed. Patient expressed understanding ?- Patient has urinary incontinence? No. ?- Patient is safe to resume physical and sexual activity in the next 1-2 weeks as the last region heals completely.  ? ?6.  Health Maintenance ?- HM due items addressed Yes ?- Last pap smear No results found for: DIAGPAP Pap smear done at today's visit.  ?-Breast Cancer screening indicated? No.  ? ?7. Chronic Disease/Pregnancy Condition follow up: Gestational Diabetes ?--Did not come in time for today's visit, so will return next week for her 2 hr GTT.  ?--Low back pain s/p epidural: Neurologically intact without unremarkable MSK exam today. Demonstrated low back stretches/strengthening exercises to try at home. Follow up if not improving/worsening in the next few months. Discussed red flags.  ? ?Follow up for GTT, otherwise annually/PRN.  ? ?Allayne Stack, DO ?Center for [employer], [facility]  ?

## 2021-12-18 LAB — CYTOLOGY - PAP: Diagnosis: NEGATIVE

## 2021-12-26 ENCOUNTER — Other Ambulatory Visit: Payer: Medicaid Other

## 2021-12-27 LAB — GLUCOSE TOLERANCE, 2 HOURS
Glucose, 2 hour: 92 mg/dL (ref 70–139)
Glucose, GTT - Fasting: 87 mg/dL (ref 70–99)

## 2022-01-02 ENCOUNTER — Encounter: Payer: Self-pay | Admitting: *Deleted

## 2022-01-30 ENCOUNTER — Telehealth: Payer: Self-pay | Admitting: *Deleted

## 2022-01-30 NOTE — Telephone Encounter (Signed)
TC from pt regarding postpartum GTT results. Informed results are normal. Advised yearly follow up for diabetes testing with PCP. Pt also requesting return to work letter. Letter printed for pt. Pt will come to the office to pick up. ?

## 2022-08-29 ENCOUNTER — Ambulatory Visit (INDEPENDENT_AMBULATORY_CARE_PROVIDER_SITE_OTHER): Payer: Medicaid Other

## 2022-08-29 VITALS — BP 121/77 | HR 89 | Ht 61.0 in | Wt 197.0 lb

## 2022-08-29 DIAGNOSIS — Z3201 Encounter for pregnancy test, result positive: Secondary | ICD-10-CM | POA: Diagnosis not present

## 2022-08-29 DIAGNOSIS — Z3482 Encounter for supervision of other normal pregnancy, second trimester: Secondary | ICD-10-CM

## 2022-08-29 LAB — POCT URINE PREGNANCY: Preg Test, Ur: POSITIVE — AB

## 2022-08-29 MED ORDER — PREPLUS 27-1 MG PO TABS: 1.0000 | ORAL_TABLET | Freq: Every day | ORAL | 13 refills | Status: DC

## 2022-08-29 NOTE — Progress Notes (Signed)
Sarah Koch presents today for UPT. She has no unusual complaints.  LMP: 06/26/2022    OBJECTIVE: Appears well, in no apparent distress.  OB History     Gravida  3   Para  1   Term  1   Preterm      AB  1   Living  1      SAB  1   IAB      Ectopic      Multiple  0   Live Births  1          Home UPT Result: POSITIVE X2 In-Office UPT result: POSITIVE  I have reviewed the patient's medical, obstetrical, social, and family histories, and medications.   ASSESSMENT: Positive pregnancy test LMP  06/26/2022 EDD   04/02/2023 GA      [redacted]w[redacted]d  PLAN Prenatal care to be completed at: [facility]

## 2022-08-29 NOTE — Progress Notes (Signed)
Attestation of Attending Supervision of clinical support staff: I agree with the care provided to this patient and was available for any consultation.  I have reviewed the CMA's note and chart, and I agree with the management and plan.  Anaiya Wisinski MD MPH, ABFM Attending Physician Faculty Practice- [facility]  

## 2022-09-05 ENCOUNTER — Ambulatory Visit (INDEPENDENT_AMBULATORY_CARE_PROVIDER_SITE_OTHER): Payer: Medicaid Other

## 2022-09-05 VITALS — BP 116/75 | HR 97 | Ht 61.0 in | Wt 195.2 lb

## 2022-09-05 DIAGNOSIS — Z3480 Encounter for supervision of other normal pregnancy, unspecified trimester: Secondary | ICD-10-CM

## 2022-09-05 DIAGNOSIS — Z3A09 9 weeks gestation of pregnancy: Secondary | ICD-10-CM | POA: Diagnosis not present

## 2022-09-05 DIAGNOSIS — O3680X0 Pregnancy with inconclusive fetal viability, not applicable or unspecified: Secondary | ICD-10-CM

## 2022-09-05 DIAGNOSIS — O0990 Supervision of high risk pregnancy, unspecified, unspecified trimester: Secondary | ICD-10-CM | POA: Insufficient documentation

## 2022-09-05 NOTE — Progress Notes (Signed)
New OB Intake  I connected with Sarah Koch on 09/05/22 at  1:10 PM EST by in person and verified that I am speaking with the correct person using two identifiers. Nurse is located at [facility] and pt is located at [place].  I discussed the limitations, risks, security and privacy concerns of performing an evaluation and management service by telephone and the availability of in person appointments. I also discussed with the patient that there may be a patient responsible charge related to this service. The patient expressed understanding and agreed to proceed.  I explained I am completing New OB Intake today. We discussed EDD of 04/02/23 that is based on LMP of 06/26/22. Pt is G3/P1011. I reviewed her allergies, medications, Medical/Surgical/OB history, and appropriate screenings. I informed her of [facility] services. Sarah Koch information placed in AVS. Based on history, this is a low risk pregnancy.  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 Patient given information for [facility] Healthy Baby website for more information about [facility]. Patient is not interested in water birth. Offered upcoming OB visit with CNM to discuss further.  MyChart/Babyscripts MyChart access verified. I explained pt will have some visits in office and some virtually. Babyscripts instructions given  and order placed. Patient verifies receipt of registration text/e-mail. Account successfully created and app downloaded.  Blood Pressure Cuff/Weight Scale Patient still has BP cuff from most recent pregnancy. Explained after first prenatal appt pt will check weekly and document in Babyscripts. Patient does have weight scale.  Anatomy US Explained first scheduled [place] will be around 19 weeks. Dating and viability US performed today. Anatomy US TBD.   Labs Discussed Avelina Laine genetic screening with patient. Would like both Panorama and Horizon drawn at new OB visit. Routine prenatal labs needed.  COVID Vaccine Patient has had COVID vaccine.   Is patient a CenteringPregnancy candidate?  Not a Candidate Declined due to  Not a candidate due to Language barrier If accepted,    Social Determinants of Health Food Insecurity: Patient denies food insecurity. WIC Referral: Patient is interested in referral to [facility].  Transportation: Patient denies transportation needs. Childcare: Discussed no children allowed at ultrasound appointments. Offered childcare services; patient declines childcare services at this time.  First visit review I reviewed new OB appt with patient. I explained they will have a provider visit that includes discuss plan of care for pregnancy. Explained pt will be seen by Coral Ceo at first visit; encounter routed to appropriate provider. Explained that patient will be seen by pregnancy navigator following visit with provider.   Hamilton Capri, RN 09/05/2022  1:15 PM

## 2022-09-06 ENCOUNTER — Encounter: Payer: Self-pay | Admitting: Obstetrics and Gynecology

## 2022-09-06 LAB — CBC/D/PLT+RPR+RH+ABO+RUBIGG...
Antibody Screen: NEGATIVE
Basophils Absolute: 0 10*3/uL (ref 0.0–0.2)
Basos: 0 %
EOS (ABSOLUTE): 0 10*3/uL (ref 0.0–0.4)
Eos: 1 %
HCV Ab: NONREACTIVE
HIV Screen 4th Generation wRfx: NONREACTIVE
Hematocrit: 38.7 % (ref 34.0–46.6)
Hemoglobin: 13.6 g/dL (ref 11.1–15.9)
Hepatitis B Surface Ag: NEGATIVE
Immature Grans (Abs): 0 10*3/uL (ref 0.0–0.1)
Immature Granulocytes: 0 %
Lymphocytes Absolute: 1.3 10*3/uL (ref 0.7–3.1)
Lymphs: 33 %
MCH: 29.3 pg (ref 26.6–33.0)
MCHC: 35.1 g/dL (ref 31.5–35.7)
MCV: 83 fL (ref 79–97)
Monocytes Absolute: 0.2 10*3/uL (ref 0.1–0.9)
Monocytes: 5 %
Neutrophils Absolute: 2.3 10*3/uL (ref 1.4–7.0)
Neutrophils: 61 %
Platelets: 197 10*3/uL (ref 150–450)
RBC: 4.64 x10E6/uL (ref 3.77–5.28)
RDW: 12.3 % (ref 11.7–15.4)
RPR Ser Ql: NONREACTIVE
Rh Factor: POSITIVE
Rubella Antibodies, IGG: 4.72 index (ref >0.99)
WBC: 3.9 10*3/uL (ref 3.4–10.8)

## 2022-09-06 LAB — HCV INTERPRETATION

## 2022-09-06 LAB — HEMOGLOBIN A1C
Est. average glucose Bld gHb Est-mCnc: 108 mg/dL
Hgb A1c MFr Bld: 5.4 % (ref 4.8–5.6)

## 2022-09-07 LAB — CULTURE, OB URINE

## 2022-09-07 LAB — URINE CULTURE, OB REFLEX

## 2022-09-10 LAB — PANORAMA PRENATAL TEST FULL PANEL:PANORAMA TEST PLUS 5 ADDITIONAL MICRODELETIONS: FETAL FRACTION: 8.5

## 2022-09-18 NOTE — L&D Delivery Note (Signed)
Delivery Note Sarah Koch is a 32 y.o. G3P1011 at [redacted]w[redacted]d admitted for IOL for A2DM.   GBS Status:  Negative/-- (06/25 1137)  Labor course: Initial SVE: 0/0/-3. Augmentation with: AROM, Pitocin, Cytotec, and IP Foley. She then progressed to complete.  ROM: rupture date, rupture time, delivery date, or delivery time have not been documented with clear fluid  Birth: After a 4 contraction 2nd stage, she delivered a Live born female  Birth Weight:   APGAR: ,   Newborn Delivery   Birth date/time: 03/[birth year]20[birth year] 23:15:00 Delivery type: Vaginal, Spontaneous        Delivered via spontaneous vaginal delivery (Presentation: LOA ). Nuchal cord present: No. . Shoulders and body delivered in usual fashion. Infant placed directly on mom's abdomen for bonding/skin-to-skin, baby dried and stimulated. Cord clamped x 2 after 1 minute and cut by [name].  Cord blood collected. Placenta delivered-Spontaneous with  . 20u Pitocin in 500cc LR given as a bolus prior to delivery of placenta.  Fundus firm with massage. Placenta inspected and appears to be intact with a 3 VC.  Sponge and instrument count were correct x2.  Intrapartum complications:  None Anesthesia:  epidural Lacerations:  none Suture Repair:  EBL (mL):184.00    Mom to postpartum.  Baby to Couplet care / Skin to Skin. Placenta to L&D   Plans to Breastfeed Contraception: none Circumcision: wants inpatient  Note sent to [facility]: [facility] for pp visit.  Delivery Report:   Review the Delivery Report for details.     Signed: Jacklyn Shell, DNP,CNM 03/29/2023, 11:46 PM

## 2022-09-21 ENCOUNTER — Telehealth: Payer: Self-pay | Admitting: Emergency Medicine

## 2022-09-21 NOTE — Telephone Encounter (Signed)
TC to patient to discuss results. Patient gender revealed as requested.  Next apt date/time discussed.

## 2022-09-21 NOTE — Telephone Encounter (Signed)
-----   Message from Chancy Milroy, MD sent at 09/21/2022  8:58 AM EST ----- Please let pt know that her Panorama genetic test was negative.  Thanks Legrand Como

## 2022-10-11 ENCOUNTER — Other Ambulatory Visit (HOSPITAL_COMMUNITY)
Admission: RE | Admit: 2022-10-11 | Discharge: 2022-10-11 | Disposition: A | Discharge status: Home / Self Care | Payer: Medicaid Other | Source: Ambulatory Visit | Attending: Obstetrics | Admitting: Obstetrics

## 2022-10-11 ENCOUNTER — Ambulatory Visit (INDEPENDENT_AMBULATORY_CARE_PROVIDER_SITE_OTHER): Payer: Medicaid Other | Admitting: Obstetrics

## 2022-10-11 ENCOUNTER — Encounter: Payer: Self-pay | Admitting: Obstetrics

## 2022-10-11 VITALS — BP 114/75 | HR 95 | Wt 200.0 lb

## 2022-10-11 DIAGNOSIS — Z3A15 15 weeks gestation of pregnancy: Secondary | ICD-10-CM

## 2022-10-11 DIAGNOSIS — Z3480 Encounter for supervision of other normal pregnancy, unspecified trimester: Secondary | ICD-10-CM | POA: Diagnosis present

## 2022-10-11 DIAGNOSIS — O99210 Obesity complicating pregnancy, unspecified trimester: Secondary | ICD-10-CM

## 2022-10-11 DIAGNOSIS — Z8632 Personal history of gestational diabetes: Secondary | ICD-10-CM

## 2022-10-11 DIAGNOSIS — O0992 Supervision of high risk pregnancy, unspecified, second trimester: Secondary | ICD-10-CM | POA: Diagnosis not present

## 2022-10-11 DIAGNOSIS — O99212 Obesity complicating pregnancy, second trimester: Secondary | ICD-10-CM | POA: Diagnosis not present

## 2022-10-11 DIAGNOSIS — Z8619 Personal history of other infectious and parasitic diseases: Secondary | ICD-10-CM

## 2022-10-11 DIAGNOSIS — O0990 Supervision of high risk pregnancy, unspecified, unspecified trimester: Secondary | ICD-10-CM

## 2022-10-11 DIAGNOSIS — Z1332 Encounter for screening for maternal depression: Secondary | ICD-10-CM | POA: Diagnosis not present

## 2022-10-11 MED ORDER — PREPLUS 27-1 MG PO TABS: 1.0000 | ORAL_TABLET | Freq: Every day | ORAL | 13 refills | Status: DC

## 2022-10-11 NOTE — Progress Notes (Signed)
NOB, Pt is requsting a letter to help get her Mother to the [place], because it is hard for her with a 32 year old child.

## 2022-10-11 NOTE — Progress Notes (Signed)
Subjective:    Sarah Koch is being seen today for her first obstetrical visit.  This is not a planned pregnancy. She is at [redacted]w[redacted]d gestation. Her obstetrical history is significant for  GDM . Relationship with [name]: significant other, not living together. Patient does intend to breast feed. Pregnancy history fully reviewed.  The information documented in the HPI was reviewed and verified.  Menstrual History: OB History     Gravida  3   Para  1   Term  1   Preterm      AB  1   Living  1      SAB  1   IAB      Ectopic      Multiple  0   Live Births  1            Patient's last menstrual period was 06/26/2022 (exact date).    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 No pertinent family history.   Review of Systems Constitutional: negative for weight loss Gastrointestinal: negative for vomiting Genitourinary:negative for genital lesions and vaginal discharge and dysuria Musculoskeletal:negative for back pain Behavioral/Psych: negative for abusive relationship, depression, illegal drug usage and tobacco use    Objective:    BP 114/75   Pulse 95   Wt 200 lb (90.7 kg)   LMP 06/26/2022 (Exact Date)   BMI 37.79 kg/m  General Appearance:    Alert, cooperative, no distress, appears stated age  Head:    Normocephalic, without obvious abnormality, atraumatic  Eyes:    PERRL, conjunctiva/corneas  clear, EOM's intact, fundi    benign, both eyes  Ears:    Normal TM's and external ear canals, both ears  Nose:   Nares normal, septum midline, mucosa normal, no drainage    or sinus tenderness  Throat:   Lips, mucosa, and tongue normal; teeth and gums normal  Neck:   Supple, symmetrical, trachea midline, no adenopathy;    thyroid:  no enlargement/tenderness/nodules; no carotid   bruit or JVD  Back:     Symmetric, no curvature, ROM normal, no CVA tenderness  Lungs:     Clear to auscultation bilaterally, respirations unlabored  Chest Wall:    No tenderness or deformity   Heart:    Regular rate and rhythm, S1 and S2 normal, no murmur, rub   or gallop  Breast Exam:    No tenderness, masses, or nipple abnormality  Abdomen:     Soft, non-tender, bowel sounds active all four quadrants,    no masses, no organomegaly  Genitalia:    Normal female without lesion, discharge or tenderness  Extremities:   Extremities normal, atraumatic, no cyanosis or edema  Pulses:   2+ and symmetric all extremities  Skin:   Skin color, texture, turgor normal, no rashes or lesions  Lymph nodes:   Cervical, supraclavicular, and axillary nodes normal  Neurologic:  CNII-XII intact, normal strength, sensation and reflexes    throughout      Lab Review Urine pregnancy test Labs reviewed yes Radiologic studies reviewed no  Assessment:    Pregnancy at [redacted]w[redacted]d weeks    Plan:    1. Supervision of high risk pregnancy, antepartum Rx: - Cervicovaginal ancillary only( [place]) - AFP, Serum, Open Spina Bifida - Prenatal Vit-Fe Fumarate-FA (PREPLUS) 27-1 MG TABS; Take 1 tablet by mouth daily.  Dispense: 30 tablet; Refill: 13  2. History of gestational diabetes mellitus (GDM) - early 2 hour GTT  3. History of group B Streptococcus (GBS) infection - treat in labor  4. Obesity in pregnancy    Prenatal vitamins.  Counseling provided regarding continued use of seat belts, cessation of alcohol consumption,  smoking or use of illicit drugs; infection precautions i.e., influenza/TDAP immunizations, toxoplasmosis,CMV, parvovirus, listeria and varicella; workplace safety, exercise during pregnancy; routine dental care, safe medications, sexual activity, hot tubs, saunas, pools, travel, caffeine use, fish and methlymercury, potential toxins, hair treatments, varicose veins Weight gain recommendations per IOM guidelines reviewed: underweight/BMI< 18.5--> gain 28 - 40 lbs; normal weight/BMI 18.5 - 24.9--> gain 25 - 35 lbs; overweight/BMI 25 - 29.9--> gain 15 - 25 lbs; obese/BMI >30->gain  11 - 20 lbs Problem list reviewed and updated. FIRST/CF mutation testing/NIPT/QUAD SCREEN/fragile X/Ashkenazi Jewish population testing/Spinal muscular atrophy discussed: requested. Role of ultrasound in pregnancy discussed; fetal survey: requested. Amniocentesis discussed: not indicated.  Meds ordered this encounter  Medications   Prenatal Vit-Fe Fumarate-FA (PREPLUS) 27-1 MG TABS    Sig: Take 1 tablet by mouth daily.    Dispense:  30 tablet    Refill:  13   Orders Placed This Encounter  Procedures   Flu Vaccine QUAD 36+ mos IM (Fluarix, Quad PF)   AFP, Serum, Open Spina Bifida    Order Specific Question:   Is patient insulin dependent?    Answer:   No    Order Specific Question:   Weight (lbs)    Answer:   200    Order Specific Question:   Gestational Age (GA), weeks    Answer:   15.2    Order Specific Question:   Date on which patient was at this [place]    Answer:   10/11/2022    Order Specific Question:   GA Calculation Method    Answer:   LMP    Order Specific Question:   Number of fetuses    Answer:   1    Order Specific Question:   Reason for screen    Answer:   PPREGN    Follow up in 4 weeks.  I have spent a total of 25 minutes of face-to-face time, excluding clinical staff time, reviewing notes and preparing to see patient, ordering tests and/or medications, and counseling the patient.   Quinlan Mcfall A.  Jodi Mourning MD 10/11/2022

## 2022-10-12 LAB — CERVICOVAGINAL ANCILLARY ONLY
Bacterial Vaginitis (gardnerella): NEGATIVE
Candida Glabrata: NEGATIVE
Candida Vaginitis: NEGATIVE
Chlamydia: NEGATIVE
Comment: NEGATIVE
Comment: NEGATIVE
Comment: NEGATIVE
Comment: NEGATIVE
Comment: NEGATIVE
Comment: NORMAL
Neisseria Gonorrhea: NEGATIVE
Trichomonas: NEGATIVE

## 2022-10-13 LAB — AFP, SERUM, OPEN SPINA BIFIDA
AFP MoM: 0.71
AFP Value: 20 ng/mL
Gest. Age on Collection Date: 15.2 weeks
Maternal Age At EDD: 29.6 yr
OSBR Risk 1 IN: 10000
Test Results:: NEGATIVE
Weight: 200 [lb_av]

## 2022-10-18 ENCOUNTER — Other Ambulatory Visit: Payer: Medicaid Other

## 2022-10-18 DIAGNOSIS — Z3480 Encounter for supervision of other normal pregnancy, unspecified trimester: Secondary | ICD-10-CM

## 2022-10-19 LAB — GLUCOSE TOLERANCE, 2 HOURS W/ 1HR
Glucose, 1 hour: 103 mg/dL (ref 70–179)
Glucose, 2 hour: 100 mg/dL (ref 70–152)
Glucose, Fasting: 86 mg/dL (ref 70–91)

## 2022-11-07 ENCOUNTER — Ambulatory Visit: Payer: Medicaid Other

## 2022-11-07 ENCOUNTER — Other Ambulatory Visit: Payer: Self-pay

## 2022-11-07 ENCOUNTER — Ambulatory Visit: Payer: Medicaid Other | Attending: Obstetrics

## 2022-11-07 VITALS — BP 129/70 | HR 95

## 2022-11-07 DIAGNOSIS — E669 Obesity, unspecified: Secondary | ICD-10-CM | POA: Diagnosis not present

## 2022-11-07 DIAGNOSIS — Z3A18 18 weeks gestation of pregnancy: Secondary | ICD-10-CM | POA: Diagnosis not present

## 2022-11-07 DIAGNOSIS — Z363 Encounter for antenatal screening for malformations: Secondary | ICD-10-CM | POA: Diagnosis not present

## 2022-11-07 DIAGNOSIS — Z3480 Encounter for supervision of other normal pregnancy, unspecified trimester: Secondary | ICD-10-CM | POA: Diagnosis present

## 2022-11-07 DIAGNOSIS — O0992 Supervision of high risk pregnancy, unspecified, second trimester: Secondary | ICD-10-CM | POA: Diagnosis not present

## 2022-11-07 DIAGNOSIS — O0990 Supervision of high risk pregnancy, unspecified, unspecified trimester: Secondary | ICD-10-CM | POA: Diagnosis present

## 2022-11-07 DIAGNOSIS — O99212 Obesity complicating pregnancy, second trimester: Secondary | ICD-10-CM

## 2022-11-09 ENCOUNTER — Encounter: Payer: Self-pay | Admitting: Obstetrics and Gynecology

## 2022-11-09 ENCOUNTER — Ambulatory Visit (INDEPENDENT_AMBULATORY_CARE_PROVIDER_SITE_OTHER): Payer: Medicaid Other | Admitting: Obstetrics and Gynecology

## 2022-11-09 VITALS — BP 105/72 | HR 101 | Wt 200.0 lb

## 2022-11-09 DIAGNOSIS — Z789 Other specified health status: Secondary | ICD-10-CM

## 2022-11-09 DIAGNOSIS — Z8632 Personal history of gestational diabetes: Secondary | ICD-10-CM

## 2022-11-09 DIAGNOSIS — O09299 Supervision of pregnancy with other poor reproductive or obstetric history, unspecified trimester: Secondary | ICD-10-CM

## 2022-11-09 DIAGNOSIS — O09292 Supervision of pregnancy with other poor reproductive or obstetric history, second trimester: Secondary | ICD-10-CM

## 2022-11-09 DIAGNOSIS — Z3480 Encounter for supervision of other normal pregnancy, unspecified trimester: Secondary | ICD-10-CM

## 2022-11-09 DIAGNOSIS — Z3A18 18 weeks gestation of pregnancy: Secondary | ICD-10-CM

## 2022-11-09 HISTORY — DX: Supervision of pregnancy with other poor reproductive or obstetric history, unspecified trimester: O09.299

## 2022-11-09 NOTE — Progress Notes (Signed)
Subjective:  Sarah Koch is a 32 y.o. G3P1011 at 2w3dbeing seen today for ongoing prenatal care.  She is currently monitored for the following issues for this high-risk pregnancy and has Varicose vein of leg; Obesity in pregnancy; Language barrier; Hyperhidrosis of axilla; Supervision of other normal pregnancy, antepartum; and History of gestational diabetes in prior pregnancy, currently pregnant on their problem list.  Patient reports no complaints.  Contractions: Not present. Vag. Bleeding: None.  Movement: Present. Denies leaking of fluid.   The following portions of the patient's history were reviewed and updated as appropriate: allergies, current medications, past family history, past medical history, past social history, past surgical history and problem list. Problem list updated.  Objective:   Vitals:   11/09/22 1054  BP: 105/72  Pulse: (!) 101  Weight: 200 lb (90.7 kg)    Fetal Status: Fetal Heart Rate (bpm): 150   Movement: Present     General:  Alert, oriented and cooperative. Patient is in no acute distress.  Skin: Skin is warm and dry. No rash noted.   Cardiovascular: Normal heart rate noted  Respiratory: Normal respiratory effort, no problems with respiration noted  Abdomen: Soft, gravid, appropriate for gestational age. Pain/Pressure: Absent     Pelvic:  Cervical exam deferred        Extremities: Normal range of motion.     Mental Status: Normal mood and affect. Normal behavior. Normal judgment and thought content.   Urinalysis:      Assessment and Plan:  Pregnancy: G3P1011 at 152w3d1. Supervision of other normal pregnancy, antepartum Stable  2. Language barrier Video interrupter used during today's visit  3. History of gestational diabetes in prior pregnancy, currently pregnant Nl early Glucola Repeat at 28 weeks  Preterm labor symptoms and general obstetric precautions including but not limited to vaginal bleeding, contractions, leaking of fluid and  fetal movement were reviewed in detail with the patient. Please refer to After Visit Summary for other counseling recommendations.  Return in about 4 weeks (around 12/07/2022) for OB visit, face to face, any provider.   ErChancy MilroyMD

## 2022-12-05 ENCOUNTER — Encounter: Payer: Self-pay | Admitting: *Deleted

## 2022-12-05 ENCOUNTER — Ambulatory Visit: Payer: Medicaid Other | Admitting: *Deleted

## 2022-12-05 ENCOUNTER — Ambulatory Visit: Payer: Medicaid Other | Attending: Obstetrics

## 2022-12-05 VITALS — BP 113/70 | HR 90

## 2022-12-05 DIAGNOSIS — Z362 Encounter for other antenatal screening follow-up: Secondary | ICD-10-CM | POA: Diagnosis not present

## 2022-12-05 DIAGNOSIS — E669 Obesity, unspecified: Secondary | ICD-10-CM

## 2022-12-05 DIAGNOSIS — Z3480 Encounter for supervision of other normal pregnancy, unspecified trimester: Secondary | ICD-10-CM | POA: Diagnosis present

## 2022-12-05 DIAGNOSIS — Z3A22 22 weeks gestation of pregnancy: Secondary | ICD-10-CM | POA: Diagnosis not present

## 2022-12-05 DIAGNOSIS — O99212 Obesity complicating pregnancy, second trimester: Secondary | ICD-10-CM | POA: Diagnosis present

## 2022-12-07 ENCOUNTER — Ambulatory Visit (INDEPENDENT_AMBULATORY_CARE_PROVIDER_SITE_OTHER): Payer: Medicaid Other | Admitting: Obstetrics and Gynecology

## 2022-12-07 VITALS — BP 123/80 | HR 98 | Wt 206.0 lb

## 2022-12-07 DIAGNOSIS — Z3A22 22 weeks gestation of pregnancy: Secondary | ICD-10-CM

## 2022-12-07 DIAGNOSIS — Z603 Acculturation difficulty: Secondary | ICD-10-CM

## 2022-12-07 DIAGNOSIS — Z8632 Personal history of gestational diabetes: Secondary | ICD-10-CM

## 2022-12-07 DIAGNOSIS — Z789 Other specified health status: Secondary | ICD-10-CM

## 2022-12-07 DIAGNOSIS — O09292 Supervision of pregnancy with other poor reproductive or obstetric history, second trimester: Secondary | ICD-10-CM

## 2022-12-07 DIAGNOSIS — O99210 Obesity complicating pregnancy, unspecified trimester: Secondary | ICD-10-CM

## 2022-12-07 DIAGNOSIS — Z3480 Encounter for supervision of other normal pregnancy, unspecified trimester: Secondary | ICD-10-CM

## 2022-12-07 DIAGNOSIS — O09299 Supervision of pregnancy with other poor reproductive or obstetric history, unspecified trimester: Secondary | ICD-10-CM

## 2022-12-07 DIAGNOSIS — O99212 Obesity complicating pregnancy, second trimester: Secondary | ICD-10-CM

## 2022-12-07 MED ORDER — ACETAMINOPHEN 500 MG PO TABS: 1000.0000 mg | ORAL_TABLET | Freq: Three times a day (TID) | ORAL | 0 refills | Status: DC | PRN

## 2022-12-07 NOTE — Patient Instructions (Signed)
Acetaminophen

## 2022-12-07 NOTE — Progress Notes (Signed)
   PRENATAL VISIT NOTE  Subjective:  Sarah Koch is a 32 y.o. G3P1011 at [redacted]w[redacted]d being seen today for ongoing prenatal care.  She is currently monitored for the following issues for this low-risk pregnancy and has Varicose vein of leg; Obesity in pregnancy; Language barrier; Hyperhidrosis of axilla; Supervision of other normal pregnancy, antepartum; and History of gestational diabetes in prior pregnancy, currently pregnant on their problem list.  Patient reports  headaches. Mild, just wondering what OTC medications she can safely take .  Contractions: Not present. Vag. Bleeding: None.  Movement: Present. Denies leaking of fluid.   The following portions of the patient's history were reviewed and updated as appropriate: allergies, current medications, past family history, past medical history, past social history, past surgical history and problem list.   Objective:   Vitals:   12/07/22 1023  BP: 123/80  Pulse: 98  Weight: 206 lb (93.4 kg)    Fetal Status: Fetal Heart Rate (bpm): 150 Fundal Height: 23 cm Movement: Present     General:  Alert, oriented and cooperative. Patient is in no acute distress.  Skin: Skin is warm and dry. No rash noted.   Cardiovascular: Normal heart rate noted  Respiratory: Normal respiratory effort, no problems with respiration noted  Abdomen: Soft, gravid, appropriate for gestational age.  Pain/Pressure: Absent      Assessment and Plan:  Pregnancy: G3P1011 at 110w3d 1. Supervision of other normal pregnancy, antepartum 2. [redacted] weeks gestation of pregnancy Discussed 2hGTT & tdap next appt w/ need for fasting prior to appt Rx for tylenol sent for HA  3. Language barrier Pt interviewed & counseled with virtual [place] interpreter  4. Obesity in pregnancy May benefit from serial growth [place] if unable to palpate fundal height  5. History of gestational diabetes in prior pregnancy, currently pregnant Routine 2h GTT next appt  Please refer to After Visit  Summary for other counseling recommendations.   Return in about 5 weeks (around 01/11/2023) for ROB at 27 weeks with 2h GTT & Tdap.  Future Appointments  Date Time Provider [place]  01/15/2023 10:55 AM Constant, Vickii Chafe, MD [facility] None  01/29/2023 10:35 AM Constant, Vickii Chafe, MD [facility] None   Inez Catalina, MD

## 2023-01-01 ENCOUNTER — Other Ambulatory Visit: Payer: Self-pay

## 2023-01-01 DIAGNOSIS — O0990 Supervision of high risk pregnancy, unspecified, unspecified trimester: Secondary | ICD-10-CM

## 2023-01-01 MED ORDER — PREPLUS 27-1 MG PO TABS: 1.0000 | ORAL_TABLET | Freq: Every day | ORAL | 13 refills | Status: DC

## 2023-01-15 ENCOUNTER — Other Ambulatory Visit: Payer: Medicaid Other

## 2023-01-15 ENCOUNTER — Encounter: Payer: Medicaid Other | Admitting: Obstetrics and Gynecology

## 2023-01-29 ENCOUNTER — Encounter: Payer: Self-pay | Admitting: Obstetrics and Gynecology

## 2023-01-29 ENCOUNTER — Ambulatory Visit (INDEPENDENT_AMBULATORY_CARE_PROVIDER_SITE_OTHER): Payer: Medicaid Other | Admitting: Obstetrics and Gynecology

## 2023-01-29 ENCOUNTER — Other Ambulatory Visit: Payer: Medicaid Other

## 2023-01-29 VITALS — BP 114/74 | HR 108 | Wt 207.0 lb

## 2023-01-29 DIAGNOSIS — Z758 Other problems related to medical facilities and other health care: Secondary | ICD-10-CM

## 2023-01-29 DIAGNOSIS — Z603 Acculturation difficulty: Secondary | ICD-10-CM

## 2023-01-29 DIAGNOSIS — Z8632 Personal history of gestational diabetes: Secondary | ICD-10-CM

## 2023-01-29 DIAGNOSIS — O09299 Supervision of pregnancy with other poor reproductive or obstetric history, unspecified trimester: Secondary | ICD-10-CM

## 2023-01-29 DIAGNOSIS — Z3480 Encounter for supervision of other normal pregnancy, unspecified trimester: Secondary | ICD-10-CM

## 2023-01-29 DIAGNOSIS — O24419 Gestational diabetes mellitus in pregnancy, unspecified control: Secondary | ICD-10-CM

## 2023-01-29 DIAGNOSIS — O99210 Obesity complicating pregnancy, unspecified trimester: Secondary | ICD-10-CM

## 2023-01-29 MED ORDER — VITAFOL ULTRA 29-0.6-0.4-200 MG PO CAPS: 1.0000 | ORAL_CAPSULE | Freq: Every day | ORAL | 12 refills | Status: DC

## 2023-01-29 NOTE — Progress Notes (Signed)
   PRENATAL VISIT NOTE  Subjective:  Sarah Koch is a 32 y.o. G3P1011 at [redacted]w[redacted]d being seen today for ongoing prenatal care.  She is currently monitored for the following issues for this high-risk pregnancy and has Varicose vein of leg; Obesity in pregnancy; Language barrier; Hyperhidrosis of axilla; Supervision of other normal pregnancy, antepartum; and History of gestational diabetes in prior pregnancy, currently pregnant on their problem list.  Patient reports no complaints.  Contractions: Not present. Vag. Bleeding: None.  Movement: Present. Denies leaking of fluid.   The following portions of the patient's history were reviewed and updated as appropriate: allergies, current medications, past family history, past medical history, past social history, past surgical history and problem list.   Objective:   Vitals:   01/29/23 0857  BP: 114/74  Pulse: (!) 108  Weight: 207 lb (93.9 kg)    Fetal Status: Fetal Heart Rate (bpm): 141 Fundal Height: 31 cm Movement: Present     General:  Alert, oriented and cooperative. Patient is in no acute distress.  Skin: Skin is warm and dry. No rash noted.   Cardiovascular: Normal heart rate noted  Respiratory: Normal respiratory effort, no problems with respiration noted  Abdomen: Soft, gravid, appropriate for gestational age.  Pain/Pressure: Absent     Pelvic: Cervical exam deferred        Extremities: Normal range of motion.  Edema: None  Mental Status: Normal mood and affect. Normal behavior. Normal judgment and thought content.   Assessment and Plan:  Pregnancy: G3P1011 at [redacted]w[redacted]d 1. Supervision of other normal pregnancy, antepartum Patient is doing well without complaints Third trimester labs and glucola today Patient does not want any contraception  2. Obesity in pregnancy Growth ultrasound ordered  3. History of gestational diabetes in prior pregnancy, currently pregnant Glucola today  4. Language barrier Tigrinian interpreter used  during the encounter  Preterm labor symptoms and general obstetric precautions including but not limited to vaginal bleeding, contractions, leaking of fluid and fetal movement were reviewed in detail with the patient. Please refer to After Visit Summary for other counseling recommendations.   No follow-ups on file.  Future Appointments  Date Time Provider Department Center  01/29/2023 10:35 AM Kara Mierzejewski, Gigi Gin, MD [facility] None    Catalina Antigua, MD

## 2023-01-29 NOTE — Progress Notes (Signed)
ROB/GTT, her insurance does not cover the PNV.

## 2023-01-30 LAB — CBC
Hematocrit: 38.1 % (ref 34.0–46.6)
Hemoglobin: 12.7 g/dL (ref 11.1–15.9)
MCH: 27.4 pg (ref 26.6–33.0)
MCHC: 33.3 g/dL (ref 31.5–35.7)
MCV: 82 fL (ref 79–97)
Platelets: 182 10*3/uL (ref 150–450)
RBC: 4.64 x10E6/uL (ref 3.77–5.28)
RDW: 13.3 % (ref 11.7–15.4)
WBC: 8.3 10*3/uL (ref 3.4–10.8)

## 2023-01-30 LAB — HIV ANTIBODY (ROUTINE TESTING W REFLEX): HIV Screen 4th Generation wRfx: NONREACTIVE

## 2023-01-30 LAB — GLUCOSE TOLERANCE, 2 HOURS W/ 1HR
Glucose, 1 hour: 234 mg/dL — ABNORMAL HIGH (ref 70–179)
Glucose, 2 hour: 180 mg/dL — ABNORMAL HIGH (ref 70–152)
Glucose, Fasting: 114 mg/dL — ABNORMAL HIGH (ref 70–91)

## 2023-01-30 LAB — RPR: RPR Ser Ql: NONREACTIVE

## 2023-02-01 NOTE — Addendum Note (Signed)
Addended by: Catalina Antigua on: 02/01/2023 08:18 AM   Modules accepted: Orders

## 2023-02-05 ENCOUNTER — Encounter: Payer: Self-pay | Admitting: Obstetrics and Gynecology

## 2023-02-05 ENCOUNTER — Ambulatory Visit (INDEPENDENT_AMBULATORY_CARE_PROVIDER_SITE_OTHER): Payer: Medicaid Other | Admitting: Obstetrics and Gynecology

## 2023-02-05 VITALS — BP 110/76 | HR 111 | Wt 218.0 lb

## 2023-02-05 DIAGNOSIS — O99210 Obesity complicating pregnancy, unspecified trimester: Secondary | ICD-10-CM

## 2023-02-05 DIAGNOSIS — Z3A31 31 weeks gestation of pregnancy: Secondary | ICD-10-CM

## 2023-02-05 DIAGNOSIS — Z3480 Encounter for supervision of other normal pregnancy, unspecified trimester: Secondary | ICD-10-CM

## 2023-02-05 DIAGNOSIS — Z758 Other problems related to medical facilities and other health care: Secondary | ICD-10-CM

## 2023-02-05 DIAGNOSIS — Z603 Acculturation difficulty: Secondary | ICD-10-CM

## 2023-02-05 DIAGNOSIS — O24410 Gestational diabetes mellitus in pregnancy, diet controlled: Secondary | ICD-10-CM

## 2023-02-05 NOTE — Progress Notes (Signed)
Pt made aware of GDM, testing supplies given today. Pt has seen N&D in past, doesn't feel she needs new appt.

## 2023-02-05 NOTE — Progress Notes (Signed)
   PRENATAL VISIT NOTE  Subjective:  Sarah Koch is a 32 y.o. G3P1011 at [redacted]w[redacted]d being seen today for ongoing prenatal care.  She is currently monitored for the following issues for this high-risk pregnancy and has Varicose vein of leg; Obesity in pregnancy; Gestational diabetes mellitus (GDM) in third trimester; Language barrier; Hyperhidrosis of axilla; Supervision of other normal pregnancy, antepartum; and History of gestational diabetes in prior pregnancy, currently pregnant on their problem list.  Patient reports no complaints.  Contractions: Not present. Vag. Bleeding: None.  Movement: Present. Denies leaking of fluid.   The following portions of the patient's history were reviewed and updated as appropriate: allergies, current medications, past family history, past medical history, past social history, past surgical history and problem list.   Objective:   Vitals:   02/05/23 1115  BP: 110/76  Pulse: (!) 111  Weight: 218 lb (98.9 kg)    Fetal Status: Fetal Heart Rate (bpm): 150 Fundal Height: 34 cm Movement: Present     General:  Alert, oriented and cooperative. Patient is in no acute distress.  Skin: Skin is warm and dry. No rash noted.   Cardiovascular: Normal heart rate noted  Respiratory: Normal respiratory effort, no problems with respiration noted  Abdomen: Soft, gravid, appropriate for gestational age.  Pain/Pressure: Absent     Pelvic: Cervical exam deferred        Extremities: Normal range of motion.     Mental Status: Normal mood and affect. Normal behavior. Normal judgment and thought content.   Assessment and Plan:  Pregnancy: G3P1011 at 109w0d 1. Supervision of other normal pregnancy, antepartum Patient is doing well without complaints   2. Diet controlled gestational diabetes mellitus (GDM) in third trimester Patient was unaware of GDM diagnosis Testing supplies provided Patient declined diabetes education as she had GDM in a previous pregnacy Importance of  euglycemia reviewed to prevent maternal/fetal adverse outcomes Follow up growth ultrasound given size greater than dates  3. Obesity in pregnancy   4. Language barrier Tigrinian interpreter used  Preterm labor symptoms and general obstetric precautions including but not limited to vaginal bleeding, contractions, leaking of fluid and fetal movement were reviewed in detail with the patient. Please refer to After Visit Summary for other counseling recommendations.   Return in about 2 weeks (around 02/19/2023) for in person, ROB, High risk.  Future Appointments  Date Time Provider Department Center  02/22/2023 11:15 AM Celedonio Savage, MD [facility] None  03/12/2023 10:30 AM [facility] NURSE [facility]  03/12/2023 10:45 AM [facility]5 [facility]    Catalina Antigua, MD

## 2023-02-22 ENCOUNTER — Ambulatory Visit (INDEPENDENT_AMBULATORY_CARE_PROVIDER_SITE_OTHER): Payer: Medicaid Other | Admitting: Family Medicine

## 2023-02-22 VITALS — BP 138/75 | HR 106 | Wt 213.5 lb

## 2023-02-22 DIAGNOSIS — Z603 Acculturation difficulty: Secondary | ICD-10-CM

## 2023-02-22 DIAGNOSIS — Z3480 Encounter for supervision of other normal pregnancy, unspecified trimester: Secondary | ICD-10-CM

## 2023-02-22 DIAGNOSIS — O24410 Gestational diabetes mellitus in pregnancy, diet controlled: Secondary | ICD-10-CM

## 2023-02-22 DIAGNOSIS — O99210 Obesity complicating pregnancy, unspecified trimester: Secondary | ICD-10-CM

## 2023-02-22 DIAGNOSIS — Z758 Other problems related to medical facilities and other health care: Secondary | ICD-10-CM

## 2023-02-22 DIAGNOSIS — O24415 Gestational diabetes mellitus in pregnancy, controlled by oral hypoglycemic drugs: Secondary | ICD-10-CM

## 2023-02-22 IMAGING — US US FETAL BPP W/ NON-STRESS
1 series · 12 of 12 positions shown · non-contrast
Comparison: none

[Series 1: us fetal bpp w/ non-stress · 12 acquisitions, 12 frames shown]
[im 1/12]
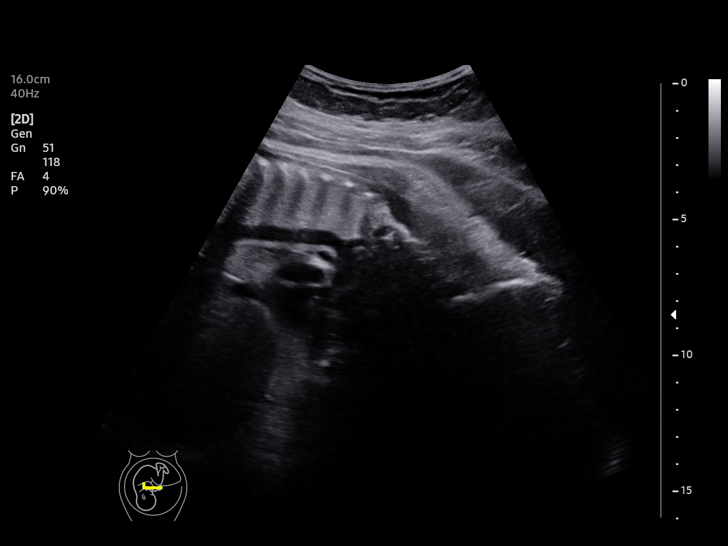
[im 2/12]
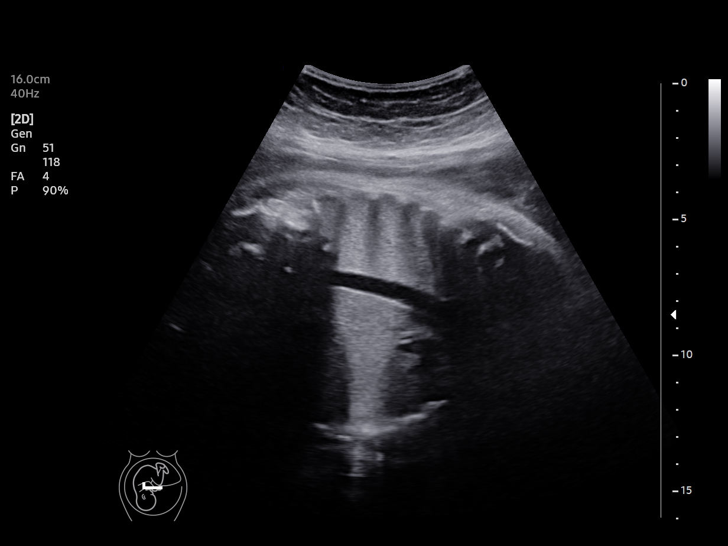
[im 3/12]
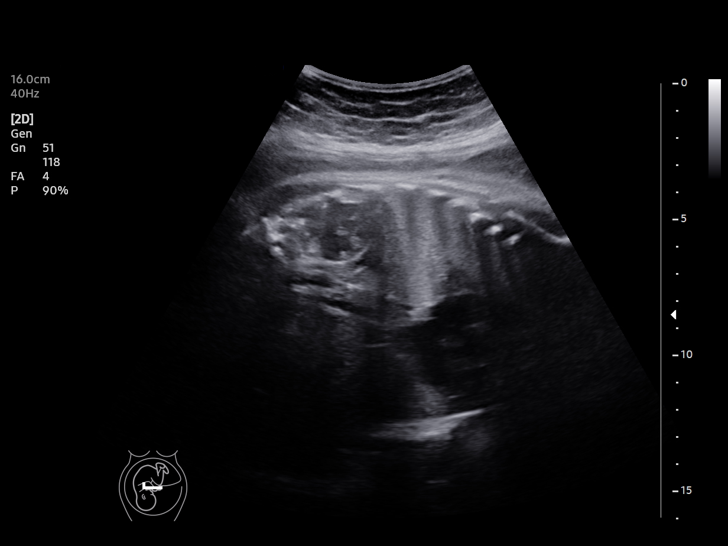
[im 4/12]
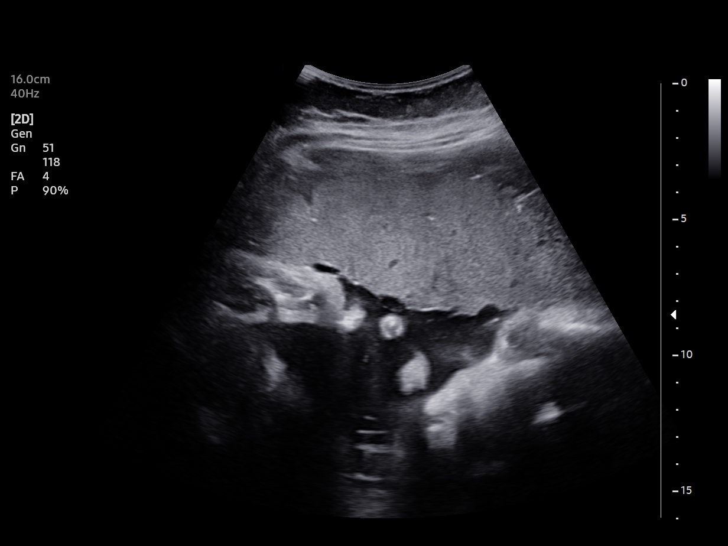
[im 5/12]
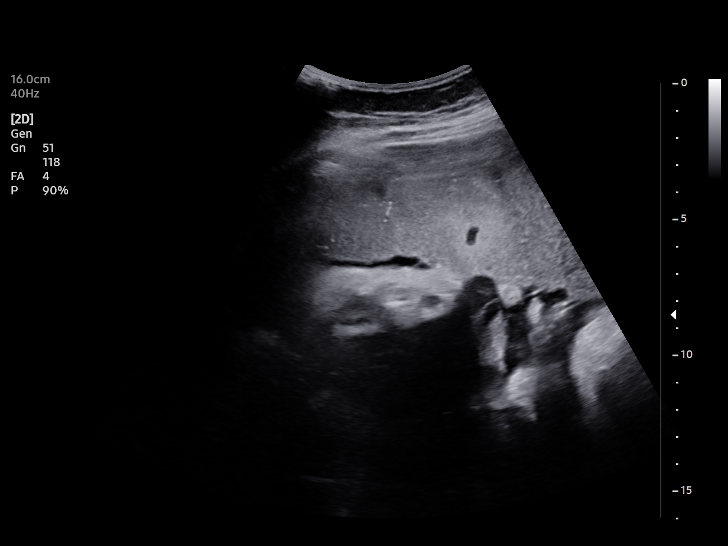
[im 6/12]
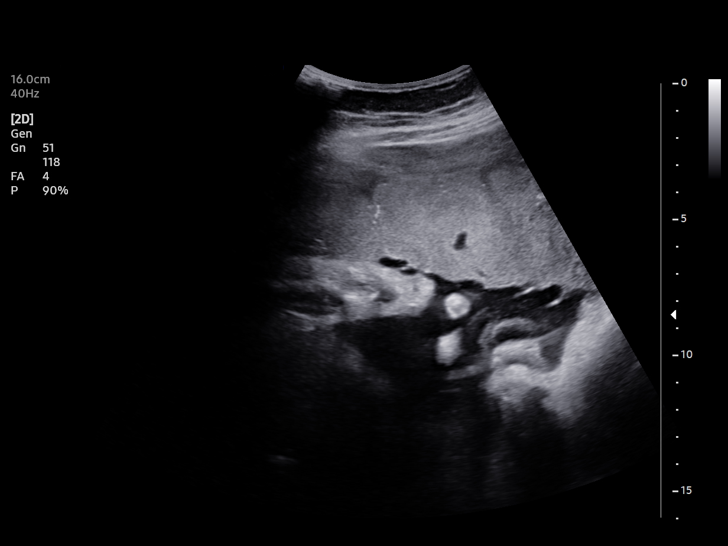
[im 7/12]
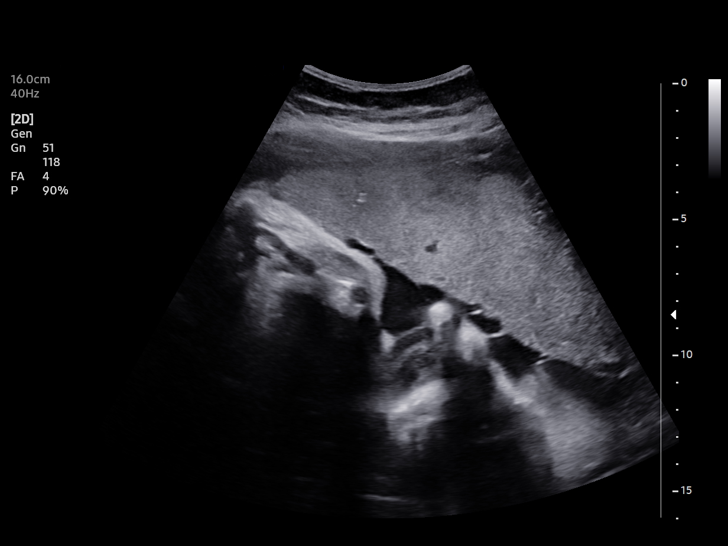
[im 8/12]
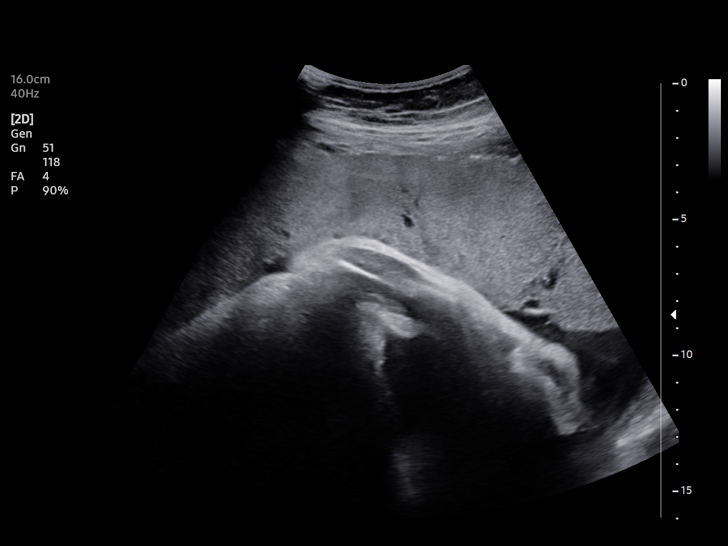
[im 9/12]
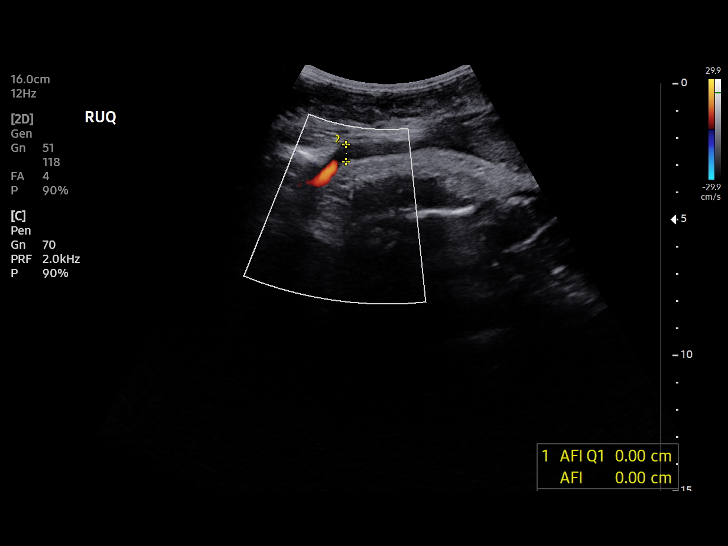
[im 10/12]
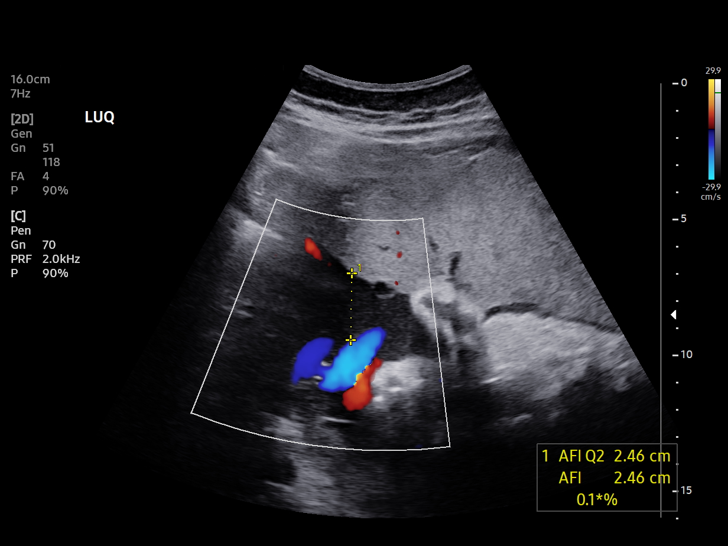
[im 11/12]
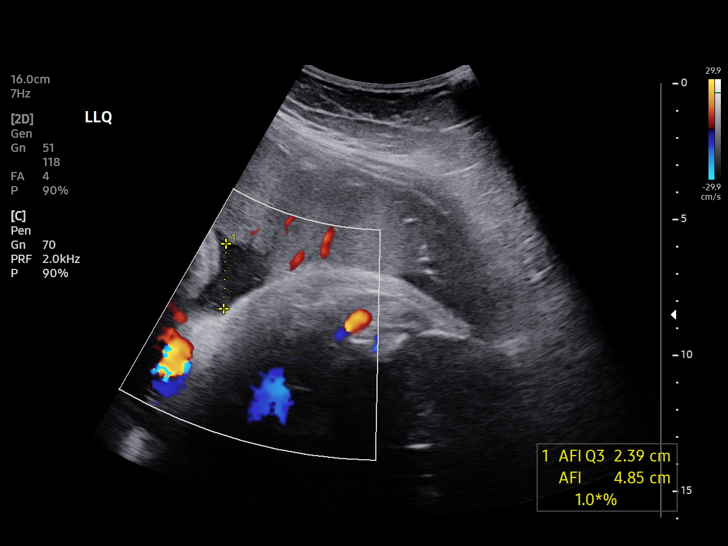
[im 12/12]
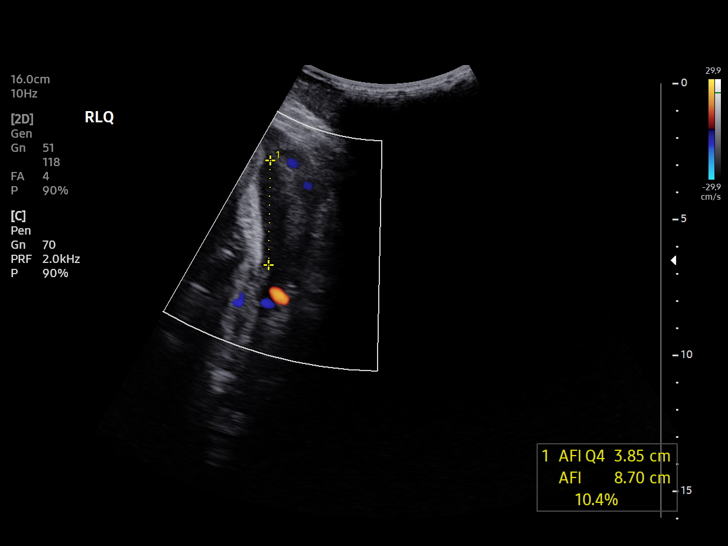

[12 of 12 positions shown; findings below may reference images not displayed]

[REDACTED]care at

 1  US FETAL BPP W/NONSTRESS              76818.4     ANH SAYERS

Indications

 37 weeks gestation of pregnancy
 Gestational diabetes in pregnancy,
 controlled by oral hypoglycemic drugs
Fetal Evaluation

 Num Of Fetuses:         1
 Preg. Location:         Intrauterine
 Cardiac Activity:       Observed
 Fetal Lie:              Maternal right side
 Presentation:           Cephalic

 Amniotic Fluid
 AFI FV:      Subjectively low-normal

 AFI Sum(cm)     %Tile       Largest Pocket(cm)
 9.33            20

 RUQ(cm)       RLQ(cm)       LUQ(cm)        LLQ(cm)

Biophysical Evaluation

 Amniotic F.V:   Pocket => 2 cm             F. Tone:        Observed
 F. Movement:    Observed                   N.S.T:          Reactive
 F. Breathing:   Observed                   Score:          [DATE]
Gestational Age

 LMP:           36w 5d        Date:  02/09/21                  EDD:   11/16/21
 Best:          37w 6d     Det. By:  U/S C R L  (04/14/21)    EDD:   11/08/21
Impression

 NST is reactive. BPP [DATE].
Recommendations

 -Weekly antenatal testing from 32 weeks till delivery .

## 2023-02-22 MED ORDER — METFORMIN HCL ER 500 MG PO TB24: 500.0000 mg | ORAL_TABLET | Freq: Every day | ORAL | 0 refills | Status: DC

## 2023-02-22 NOTE — Progress Notes (Signed)
Pt presents for ROB. Reports +FM, denies ctx, VB or LOF.

## 2023-02-22 NOTE — Progress Notes (Signed)
   PRENATAL VISIT NOTE  Subjective:  Sarah Koch is a 32 y.o. G3P1011 at [redacted]w[redacted]d being seen today for ongoing prenatal care.  She is currently monitored for the following issues for this high-risk pregnancy and has Varicose vein of leg; Obesity in pregnancy; Gestational diabetes mellitus (GDM) in third trimester; Language barrier; Hyperhidrosis of axilla; Supervision of other normal pregnancy, antepartum; and History of gestational diabetes in prior pregnancy, currently pregnant on their problem list.  Patient reports no complaints.  Contractions: Not present.  .  Movement: Present. Denies leaking of fluid.   The following portions of the patient's history were reviewed and updated as appropriate: allergies, current medications, past family history, past medical history, past social history, past surgical history and problem list.   Objective:   Vitals:   02/22/23 1122  BP: 138/75  Pulse: (!) 106  Weight: 213 lb 8 oz (96.8 kg)    Fetal Status: Fetal Heart Rate (bpm): 140   Movement: Present     General:  Alert, oriented and cooperative. Patient is in no acute distress.  Skin: Skin is warm and dry. No rash noted.   Cardiovascular: Normal heart rate noted  Respiratory: Normal respiratory effort, no problems with respiration noted  Abdomen: Soft, gravid, appropriate for gestational age.  Pain/Pressure: Absent     Pelvic: Cervical exam deferred        Extremities: Normal range of motion.  Edema: Trace  Mental Status: Normal mood and affect. Normal behavior. Normal judgment and thought content.   Assessment and Plan:  Pregnancy: G3P1011 at [redacted]w[redacted]d 1. Supervision of other normal pregnancy, antepartum Continue routine prenatal care  2. Diet controlled gestational diabetes mellitus (GDM) in third trimester Elevated fasting blood sugars.  Verify that the these were not truly fasting blood sugars.  Started patient on metformin 500 mg nightly.  Will start weekly BPP's at 32 weeks.  3.  Language barrier Interpreter used for visit  4. Obesity in pregnancy  5. Gestational diabetes mellitus (GDM) in third trimester controlled on oral hypoglycemic drug See above - [place] MFM FETAL BPP WO NON STRESS; Future  Preterm labor symptoms and general obstetric precautions including but not limited to vaginal bleeding, contractions, leaking of fluid and fetal movement were reviewed in detail with the patient. Please refer to After Visit Summary for other counseling recommendations.   No follow-ups on file.  Future Appointments  Date Time Provider Department Center  03/08/2023 11:15 AM Warden Fillers, MD [facility] None  03/12/2023 10:30 AM [facility] NURSE [facility]  03/12/2023 10:45 AM [facility]5 [facility]    Celedonio Savage, MD

## 2023-02-23 NOTE — Progress Notes (Incomplete)
   PRENATAL VISIT NOTE  Subjective:  Sarah Koch is a 32 y.o. G3P1011 at [redacted]w[redacted]d being seen today for ongoing prenatal care.  She is currently monitored for the following issues for this high-risk pregnancy and has Varicose vein of leg; Obesity in pregnancy; Gestational diabetes mellitus (GDM) in third trimester; Language barrier; Hyperhidrosis of axilla; Supervision of other normal pregnancy, antepartum; and History of gestational diabetes in prior pregnancy, currently pregnant on their problem list.  Patient reports no complaints.  Contractions: Not present.  .  Movement: Present. Denies leaking of fluid.   The following portions of the patient's history were reviewed and updated as appropriate: allergies, current medications, past family history, past medical history, past social history, past surgical history and problem list.   Objective:   Vitals:   02/22/23 1122  BP: 138/75  Pulse: (!) 106  Weight: 213 lb 8 oz (96.8 kg)    Fetal Status: Fetal Heart Rate (bpm): 140   Movement: Present     General:  Alert, oriented and cooperative. Patient is in no acute distress.  Skin: Skin is warm and dry. No rash noted.   Cardiovascular: Normal heart rate noted  Respiratory: Normal respiratory effort, no problems with respiration noted  Abdomen: Soft, gravid, appropriate for gestational age.  Pain/Pressure: Absent     Pelvic: Cervical exam deferred        Extremities: Normal range of motion.  Edema: Trace  Mental Status: Normal mood and affect. Normal behavior. Normal judgment and thought content.   Assessment and Plan:  Pregnancy: G3P1011 at [redacted]w[redacted]d 1. Supervision of other normal pregnancy, antepartum Continue routine prenatal care  2. Diet controlled gestational diabetes mellitus (GDM) in third trimester Elevated fasting blood sugars.   3. Language barrier ***  4. Obesity in pregnancy ***  5. Gestational diabetes mellitus (GDM) in third trimester controlled on oral hypoglycemic  drug *** - [place] MFM FETAL BPP WO NON STRESS; Future  {Blank single:19197::"Term","Preterm"} labor symptoms and general obstetric precautions including but not limited to vaginal bleeding, contractions, leaking of fluid and fetal movement were reviewed in detail with the patient. Please refer to After Visit Summary for other counseling recommendations.   No follow-ups on file.  Future Appointments  Date Time Provider Department Center  03/08/2023 11:15 AM Warden Fillers, MD [facility] None  03/12/2023 10:30 AM [facility] NURSE [facility]  03/12/2023 10:45 AM [facility]    Celedonio Savage, MD

## 2023-03-02 ENCOUNTER — Encounter: Payer: Self-pay | Admitting: *Deleted

## 2023-03-06 ENCOUNTER — Other Ambulatory Visit: Payer: Self-pay | Admitting: Family Medicine

## 2023-03-06 ENCOUNTER — Ambulatory Visit: Payer: Medicaid Other | Attending: Family Medicine

## 2023-03-06 ENCOUNTER — Ambulatory Visit: Payer: Medicaid Other | Admitting: *Deleted

## 2023-03-06 ENCOUNTER — Other Ambulatory Visit: Payer: Self-pay | Admitting: *Deleted

## 2023-03-06 VITALS — BP 98/67 | HR 120

## 2023-03-06 DIAGNOSIS — Z3480 Encounter for supervision of other normal pregnancy, unspecified trimester: Secondary | ICD-10-CM | POA: Insufficient documentation

## 2023-03-06 DIAGNOSIS — O24415 Gestational diabetes mellitus in pregnancy, controlled by oral hypoglycemic drugs: Secondary | ICD-10-CM

## 2023-03-06 DIAGNOSIS — Z3A35 35 weeks gestation of pregnancy: Secondary | ICD-10-CM | POA: Insufficient documentation

## 2023-03-06 DIAGNOSIS — O99213 Obesity complicating pregnancy, third trimester: Secondary | ICD-10-CM | POA: Diagnosis not present

## 2023-03-06 DIAGNOSIS — E669 Obesity, unspecified: Secondary | ICD-10-CM | POA: Diagnosis not present

## 2023-03-06 DIAGNOSIS — O99212 Obesity complicating pregnancy, second trimester: Secondary | ICD-10-CM

## 2023-03-08 ENCOUNTER — Encounter: Payer: Medicaid Other | Admitting: Obstetrics and Gynecology

## 2023-03-12 ENCOUNTER — Ambulatory Visit: Payer: Medicaid Other | Attending: Obstetrics and Gynecology

## 2023-03-12 ENCOUNTER — Ambulatory Visit: Payer: Medicaid Other

## 2023-03-13 ENCOUNTER — Inpatient Hospital Stay (HOSPITAL_COMMUNITY)
Admission: AD | Admit: 2023-03-13 | Discharge: 2023-03-13 | Disposition: A | Discharge status: Home / Self Care | Payer: Medicaid Other | Attending: Obstetrics & Gynecology | Admitting: Obstetrics & Gynecology

## 2023-03-13 ENCOUNTER — Other Ambulatory Visit (HOSPITAL_COMMUNITY)
Admission: RE | Admit: 2023-03-13 | Discharge: 2023-03-13 | Disposition: A | Discharge status: Home / Self Care | Payer: Medicaid Other | Source: Ambulatory Visit | Attending: Obstetrics and Gynecology | Admitting: Obstetrics and Gynecology

## 2023-03-13 ENCOUNTER — Encounter: Payer: Self-pay | Admitting: Obstetrics & Gynecology

## 2023-03-13 ENCOUNTER — Ambulatory Visit (INDEPENDENT_AMBULATORY_CARE_PROVIDER_SITE_OTHER): Payer: Medicaid Other | Admitting: Obstetrics & Gynecology

## 2023-03-13 ENCOUNTER — Inpatient Hospital Stay (HOSPITAL_BASED_OUTPATIENT_CLINIC_OR_DEPARTMENT_OTHER): Payer: Medicaid Other

## 2023-03-13 ENCOUNTER — Encounter (HOSPITAL_COMMUNITY): Payer: Self-pay | Admitting: Obstetrics & Gynecology

## 2023-03-13 VITALS — BP 104/72 | Wt 217.0 lb

## 2023-03-13 DIAGNOSIS — E669 Obesity, unspecified: Secondary | ICD-10-CM

## 2023-03-13 DIAGNOSIS — O24415 Gestational diabetes mellitus in pregnancy, controlled by oral hypoglycemic drugs: Secondary | ICD-10-CM | POA: Diagnosis not present

## 2023-03-13 DIAGNOSIS — O99213 Obesity complicating pregnancy, third trimester: Secondary | ICD-10-CM

## 2023-03-13 DIAGNOSIS — Z3A36 36 weeks gestation of pregnancy: Secondary | ICD-10-CM | POA: Insufficient documentation

## 2023-03-13 DIAGNOSIS — O3663X0 Maternal care for excessive fetal growth, third trimester, not applicable or unspecified: Secondary | ICD-10-CM

## 2023-03-13 DIAGNOSIS — O288 Other abnormal findings on antenatal screening of mother: Secondary | ICD-10-CM | POA: Diagnosis not present

## 2023-03-13 DIAGNOSIS — O368130 Decreased fetal movements, third trimester, not applicable or unspecified: Secondary | ICD-10-CM | POA: Diagnosis present

## 2023-03-13 DIAGNOSIS — O0993 Supervision of high risk pregnancy, unspecified, third trimester: Secondary | ICD-10-CM

## 2023-03-13 DIAGNOSIS — Z3689 Encounter for other specified antenatal screening: Secondary | ICD-10-CM

## 2023-03-13 DIAGNOSIS — O289 Unspecified abnormal findings on antenatal screening of mother: Secondary | ICD-10-CM | POA: Diagnosis not present

## 2023-03-13 MED ORDER — METFORMIN HCL ER 500 MG PO TB24: 1000.0000 mg | ORAL_TABLET | Freq: Every day | ORAL | 1 refills | Status: DC

## 2023-03-13 NOTE — Progress Notes (Addendum)
PRENATAL VISIT NOTE  Subjective:  Sarah Koch is a 32 y.o. G3P1011 at [redacted]w[redacted]d being seen today for ongoing prenatal care. [employer] interpreter used for encounter.  She is currently monitored for the following issues for this high-risk pregnancy and has Obesity in pregnancy; Gestational diabetes mellitus (GDM) in third trimester; Language barrier; Supervision of high-risk pregnancy; and History of gestational diabetes in prior pregnancy, currently pregnant on their problem list.  Patient reports no complaints.  Contractions: Not present. Vag. Bleeding: None.  Movement: Present. Denies leaking of fluid.   The following portions of the patient's history were reviewed and updated as appropriate: allergies, current medications, past family history, past medical history, past social history, past surgical history and problem list.   Objective:   Vitals:   03/13/23 1118  BP: 104/72  Weight: 217 lb (98.4 kg)    Fetal Status:     Movement: Present     General:  Alert, oriented and cooperative. Patient is in no acute distress.  Skin: Skin is warm and dry. No rash noted.   Cardiovascular: Normal heart rate noted  Respiratory: Normal respiratory effort, no problems with respiration noted  Abdomen: Soft, gravid, appropriate for gestational age.  Pain/Pressure: Absent     Pelvic: Cervical exam deferred but cultures obtained in the presence of a chaperone        Extremities: Normal range of motion.  Edema: Trace  Mental Status: Normal mood and affect. Normal behavior. Normal judgment and thought content.   [place] MFM FETAL BPP WO NON STRESS  Result Date: 03/06/2023 ----------------------------------------------------------------------  OBSTETRICS REPORT                       (Signed Final 03/06/2023 11:39 am) ---------------------------------------------------------------------- Patient Info  ID #:       [id number]                          D.O.B.:  [birth year] (31 yrs)  Name:       Sarah Koch                     Visit Date: 03/06/2023 10:53 am ---------------------------------------------------------------------- Performed By  Attending:        Noralee Space MD        Ref. Address:     [address]                                                             [address]                                                             [place]  [address]  Performed By:     Emeline Darling BS,      [facility]                                     [facility]  Referred By:      [facility] ---------------------------------------------------------------------- Orders  #  Description                           Code        Ordered By  1  [place] MFM FETAL BPP WO NON               76819.01    [facility]     STRESS  2  [place] MFM OB FOLLOW UP                   76816.01    Marcheta Grammes ----------------------------------------------------------------------  #  Order #                     Accession #                Episode #  1  [id number]                   [id number]                 [id number]  2  [id number]                   [id number]                 [id number] ---------------------------------------------------------------------- Indications  Gestational diabetes in pregnancy,             O24.415  controlled by oral hypoglycemic drugs  Obesity complicating pregnancy, second         O99.212  trimester (PG BMI 33)  [redacted] weeks gestation of pregnancy                Z3A.35  LR NIPS/AFP Neg/Horizon neg(2022) ---------------------------------------------------------------------- Vital Signs  BP:          98/67 ---------------------------------------------------------------------- Fetal Evaluation  Num Of Fetuses:         1  Fetal Heart  Rate(bpm):  134  Cardiac Activity:       Observed  Presentation:           Cephalic  Placenta:  Posterior  P. Cord Insertion:      Previously visualized  Amniotic Fluid  AFI FV:      Within normal limits  AFI Sum(cm)     %Tile       Largest Pocket(cm)  9.61            18          4.34  RUQ(cm)       RLQ(cm)       LUQ(cm)        LLQ(cm)  1.36          1.18          2.73           4.34 ---------------------------------------------------------------------- Biophysical Evaluation  Amniotic F.V:   Pocket => 2 cm             F. Tone:        Observed  F. Movement:    Observed                   Score:          8/8  F. Breathing:   Observed ---------------------------------------------------------------------- Biometry  BPD:      87.2  mm     G. Age:  35w 1d         55  %    CI:        77.74   %    70 - 86                                                          FL/HC:      23.5   %    20.1 - 22.3  HC:       313   mm     G. Age:  35w 0d         16  %    HC/AC:      0.93        0.93 - 1.11  AC:       335   mm     G. Age:  37w 3d         97  %    FL/BPD:     84.3   %    71 - 87  FL:       73.5  mm     G. Age:  37w 4d         93  %    FL/AC:      21.9   %    20 - 24  Est. FW:    3069  gm    6 lb 12 oz      91  % ---------------------------------------------------------------------- OB History  Blood Type:   B+  Gravidity:    3         Term:   1         SAB:   1  Living:       1 ---------------------------------------------------------------------- Gestational Age  LMP:           36w 1d        Date:  06/26/22                  EDD:   04/02/23  U/S Today:  36w 2d                                        EDD:   04/01/23  Best:          35w 1d     Det. ByMarcella Dubs         EDD:   04/09/23                                      (09/05/22) ---------------------------------------------------------------------- Anatomy  Cranium:               Appears normal         Aortic Arch:            Previously seen  Cavum:                  Previously seen        Ductal Arch:            Previously seen  Ventricles:            Appears normal         Diaphragm:              Appears normal  Choroid Plexus:        Previously seen        Stomach:                Appears normal, left                                                                        sided  Cerebellum:            Previously seen        Abdomen:                Appears normal  Posterior Fossa:       Previously seen        Abdominal Wall:         Previously seen  Nuchal Fold:           Previously seen        Cord Vessels:           Previously seen  Face:                  Orbits and profile     Kidneys:                Appear normal                         previously seen  Lips:                  Previously seen        Bladder:                Appears normal  Thoracic:              Appears normal         Spine:  Previously seen  Heart:                 Appears normal         Upper Extremities:      Previously seen                         (4CH, axis, and                         situs)  RVOT:                  Appears normal         Lower Extremities:      Previously seen  LVOT:                  Previously seen  Other:  Fetus appears to be a female. Open hands, nasal bone, lenses,          maxilla, mandible, falx, Heels/feetVC, 3VV and 3VTV previously          visualized. ---------------------------------------------------------------------- Cervix Uterus Adnexa  Cervix  Not visualized (advanced GA >24wks) ---------------------------------------------------------------------- Impression  Gestational diabetes.  Patient takes metformin.  Fasting  levels are below 95 mg/DL and postprandial levels between  120 and 130 mg/DL.  The estimated fetal weight is at the 91st percentile and the  abdominal circumference measurement is at the 97th  percentile.  Amniotic fluid is normal and good fetal activity  seen.  Cephalic presentation.  Antenatal testing is reassuring.  BPP 8/8.  ---------------------------------------------------------------------- Recommendations  - Continue weekly BPP or NST till delivery. ----------------------------------------------------------------------                 Noralee Space, MD Electronically Signed Final Report   03/06/2023 11:39 am ----------------------------------------------------------------------  [place] MFM OB FOLLOW UP  Result Date: 03/06/2023 ----------------------------------------------------------------------  OBSTETRICS REPORT                       (Signed Final 03/06/2023 11:39 am) ---------------------------------------------------------------------- Patient Info  ID #:       [id number]                          D.O.B.:  10-Aug-1991 (31 yrs)  Name:       Sarah Koch                    Visit Date: 03/06/2023 10:53 am ---------------------------------------------------------------------- Performed By  Attending:        Noralee Space MD        Ref. Address:     [address]                                                             [phone]  [place]                                                             [address]  Performed By:     Emeline Darling BS,      Location[facility]                                     [facility]  Referred By:      [facility] ---------------------------------------------------------------------- Orders  #  Description                           Code        Ordered By  1  [place] MFM FETAL BPP WO NON               76819.01    [facility]     STRESS  2  [place] MFM OB FOLLOW UP                   76816.01    Marcheta Grammes ----------------------------------------------------------------------  #  Order #                      Accession #                Episode #  1  [id number]                   [id number]                 [id number]  2  [id number]                   [id number]                 [id number] ---------------------------------------------------------------------- Indications  Gestational diabetes in pregnancy,             O24.415  controlled by oral hypoglycemic drugs  Obesity complicating pregnancy, second         O99.212  trimester (PG BMI 33)  [redacted] weeks gestation of pregnancy                Z3A.35  LR NIPS/AFP Neg/Horizon neg(2022) ---------------------------------------------------------------------- Vital Signs  BP:  98/67 ---------------------------------------------------------------------- Fetal Evaluation  Num Of Fetuses:         1  Fetal Heart Rate(bpm):  134  Cardiac Activity:       Observed  Presentation:           Cephalic  Placenta:               Posterior  P. Cord Insertion:      Previously visualized  Amniotic Fluid  AFI FV:      Within normal limits  AFI Sum(cm)     %Tile       Largest Pocket(cm)  9.61            18          4.34  RUQ(cm)       RLQ(cm)       LUQ(cm)        LLQ(cm)  1.36          1.18          2.73           4.34 ---------------------------------------------------------------------- Biophysical Evaluation  Amniotic F.V:   Pocket => 2 cm             F. Tone:        Observed  F. Movement:    Observed                   Score:          8/8  F. Breathing:   Observed ---------------------------------------------------------------------- Biometry  BPD:      87.2  mm     G. Age:  35w 1d         55  %    CI:        77.74   %    70 - 86                                                          FL/HC:      23.5   %    20.1 - 22.3  HC:       313   mm     G. Age:  35w 0d         16  %    HC/AC:      0.93        0.93 - 1.11  AC:       335   mm     G. Age:  37w 3d         97  %    FL/BPD:     84.3   %    71 - 87  FL:       73.5  mm     G. Age:  37w 4d         93  %    FL/AC:      21.9   %    20 - 24  Est. FW:     3069  gm    6 lb 12 oz      91  % ---------------------------------------------------------------------- OB History  Blood Type:   B+  Gravidity:    3         Term:   1         SAB:  1  Living:       1 ---------------------------------------------------------------------- Gestational Age  LMP:           36w 1d        Date:  06/26/22                  EDD:   04/02/23  U/S Today:     36w 2d                                        EDD:   04/01/23  Best:          35w 1d     Det. ByMarcella Dubs         EDD:   04/09/23                                      (09/05/22) ---------------------------------------------------------------------- Anatomy  Cranium:               Appears normal         Aortic Arch:            Previously seen  Cavum:                 Previously seen        Ductal Arch:            Previously seen  Ventricles:            Appears normal         Diaphragm:              Appears normal  Choroid Plexus:        Previously seen        Stomach:                Appears normal, left                                                                        sided  Cerebellum:            Previously seen        Abdomen:                Appears normal  Posterior Fossa:       Previously seen        Abdominal Wall:         Previously seen  Nuchal Fold:           Previously seen        Cord Vessels:           Previously seen  Face:                  Orbits and profile     Kidneys:                Appear normal                         previously seen  Lips:  Previously seen        Bladder:                Appears normal  Thoracic:              Appears normal         Spine:                  Previously seen  Heart:                 Appears normal         Upper Extremities:      Previously seen                         (4CH, axis, and                         situs)  RVOT:                  Appears normal         Lower Extremities:      Previously seen  LVOT:                  Previously seen  Other:  Fetus appears to  be a female. Open hands, nasal bone, lenses,          maxilla, mandible, falx, Heels/feetVC, 3VV and 3VTV previously          visualized. ---------------------------------------------------------------------- Cervix Uterus Adnexa  Cervix  Not visualized (advanced GA >24wks) ---------------------------------------------------------------------- Impression  Gestational diabetes.  Patient takes metformin.  Fasting  levels are below 95 mg/DL and postprandial levels between  120 and 130 mg/DL.  The estimated fetal weight is at the 91st percentile and the  abdominal circumference measurement is at the 97th  percentile.  Amniotic fluid is normal and good fetal activity  seen.  Cephalic presentation.  Antenatal testing is reassuring.  BPP 8/8. ---------------------------------------------------------------------- Recommendations  - Continue weekly BPP or NST till delivery. ----------------------------------------------------------------------                 Noralee Space, MD Electronically Signed Final Report   03/06/2023 11:39 am ----------------------------------------------------------------------   Assessment and Plan:  Pregnancy: G3P1011 at [redacted]w[redacted]d 1. Gestational diabetes mellitus (GDM) in third trimester controlled on oral hypoglycemic drug Still elevated fastings with most 95-100s, most PPs within range, a couple in 130s.  Will increase Metformin XR to 1000 mg po at bedtime. Patient informed. Continue weekly antenatal testing. She missed scheduled BPP at MFM yesterday, will get NST here today. Emphasized importance of adherence to weekly testing as recommended.  - metFORMIN (GLUCOPHAGE-XR) 500 MG 24 hr tablet; Take 2 tablets (1,000 mg total) by mouth at bedtime.  Dispense: 60 tablet; Refill: 1 - Fetal nonstress test   2. Excessive fetal growth affecting management of pregnancy in third trimester, single or unspecified fetus Continue scans as per MFM and delivery recommendations as per MFM.   3. [redacted] weeks  gestation of pregnancy 4. Supervision of high risk pregnancy in third trimester Pelvic cultures done today, will follow up results and manage accordingly. - Cervicovaginal ancillary only( [place]) - Culture, beta strep (group b only) Preterm labor symptoms and general obstetric precautions including but not limited to vaginal bleeding, contractions, leaking of fluid and fetal movement were reviewed in detail with the patient. Please refer to After Visit Summary for other counseling recommendations.   Return  in about 1 week (around 03/20/2023) for OFFICE OB VISIT (MD only).  Future Appointments  Date Time Provider Department Center  03/20/2023  9:55 AM Adam Phenix, MD [facility] None  03/21/2023  9:30 AM [facility] NURSE [facility]  03/21/2023  9:45 AM [facility] NST [facility]  03/28/2023  9:55 AM Corlis Hove, NP [facility] None  04/04/2023 10:35 AM Warden Fillers, MD [facility] None  04/10/2023 10:35 AM Constant, Gigi Gin, MD [facility] None    Jaynie Collins, MD    Addendum: NST reviewed. Baseline 120s, minimal variability, no accelerations or decelerations. No contractions noted.  No change in FHR monitoring despite administration of ice cold water to patient.  Patient will be sent to MAU for prolonged monitoring, further evaluation for this non-reactive NST.  MAU Provider and RN in charge notified.   Jaynie Collins, MD

## 2023-03-13 NOTE — Patient Instructions (Signed)
Return to office for any scheduled appointments. Call the office or go to the MAU at [facility] if: You begin to have strong, frequent contractions Your water breaks.  Sometimes it is a big gush of fluid, sometimes it is just a trickle that keeps getting your underwear wet or running down your legs You have vaginal bleeding.  It is normal to have a small amount of spotting if your cervix was checked.  You do not feel your baby moving like normal.  If you do not, get something to eat and drink and lay down and focus on feeling your baby move.   If your baby is still not moving like normal, you should call the office or go to MAU. Any other obstetric concerns.  

## 2023-03-13 NOTE — MAU Provider Note (Signed)
History     CSN: [id number]  Arrival date and time: 03/13/23 1238   Event Date/Time   First Provider Initiated Contact with Patient 03/13/23 1341      Chief Complaint  Patient presents with   Decreased Fetal Movement   Ms. Katrinna Hanlan is a 32 y.o. year old G12P1011 female at [redacted]w[redacted]d weeks gestation who was sent to MAU from the office for NRNST and DFM today. She reports good (+) FM since arrival to MAU. She states, "he is moving around a whole lot now." She denies pain, VB or LOF. Her pregnancy is complicated by GDM, obesity and language barrier. She receives [facility] with [facility]; next appt is 03/20/2023.   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  Drysol was ineffective.  Await lab results.  If not pregnant, would consider a trial of oxybutynin 5 mg 3 times daily.   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   Varicose vein of leg 01/04/2016    Past Surgical History:  Procedure Laterality Date   NO PAST SURGERIES      Family History  Problem Relation Age of Onset   Asthma Neg Hx    Cancer Neg Hx    Diabetes Neg Hx    Heart disease Neg Hx    Hypertension Neg Hx     Social History   Tobacco Use   Smoking status: Never   Smokeless tobacco: Never  Vaping Use   Vaping Use: Never used  Substance Use Topics   Alcohol use: No    Alcohol/week: 0.0 standard drinks of  alcohol   Drug use: No    Allergies: No Known Allergies  Medications Prior to Admission  Medication Sig Dispense Refill Last Dose   metFORMIN (GLUCOPHAGE-XR) 500 MG 24 hr tablet Take 2 tablets (1,000 mg total) by mouth at bedtime. 60 tablet 1 03/13/2023   Prenat-Fe Poly-Methfol-FA-DHA (VITAFOL ULTRA) 29-0.6-0.4-200 MG CAPS Take 1 capsule by mouth daily. 30 capsule 12 03/13/2023   acetaminophen (TYLENOL) 500 MG tablet Take 2 tablets (1,000 mg total) by mouth every 8 (eight) hours as needed. 60 tablet 0    ferrous sulfate 324 MG TBEC Take 324 mg by mouth.      glucose blood (ACCU-CHEK GUIDE) test strip Use to check blood sugars four times a day was instructed 50 each 12    polyethylene glycol (MIRALAX / GLYCOLAX) 17 g packet Take 17 g by mouth daily. (Patient not taking: Reported on 12/07/2022) 14 each 0    Prenatal Vit-Fe Fumarate-FA (PREPLUS) 27-1 MG TABS Take 1 tablet by mouth daily. (Patient not taking: Reported on 02/22/2023) 30 tablet 13  Review of Systems  Constitutional: Negative.   HENT: Negative.    Eyes: Negative.   Respiratory: Negative.    Cardiovascular: Negative.   Gastrointestinal: Negative.   Endocrine: Negative.   Genitourinary:        DFM today, but "feeling him move a lot" since being in MAU   Musculoskeletal: Negative.   Skin: Negative.   Allergic/Immunologic: Negative.   Neurological: Negative.   Hematological: Negative.   Psychiatric/Behavioral: Negative.     Physical Exam   Blood pressure 105/71, pulse (!) 114, temperature 98.2 F (36.8 C), resp. rate 18, weight 98 kg, last menstrual period 06/26/2022, currently breastfeeding.  Physical Exam Vitals and nursing note reviewed.  Cardiovascular:     Rate and Rhythm: Tachycardia present.  Abdominal:     Palpations: Abdomen is soft.  Genitourinary:    Comments: Not indicated Musculoskeletal:        General: Normal range of motion.  Skin:    General: Skin is warm and dry.  Neurological:     Mental  Status: She is alert and oriented to person, place, and time.  Psychiatric:        Mood and Affect: Mood normal.        Behavior: Behavior normal.        Thought Content: Thought content normal.        Judgment: Judgment normal.    REACTIVE NST - FHR: 140 bpm / moderate variability / accels present / decels absent / TOCO: occ UI noted  MAU Course  Procedures  MDM NST BPP  *Consult with Dr. Adrian Blackwater @ 1404 - notified of patient's complaints, assessments, & NST results, recommended tx plan continue with BPP - ok to d/c home, if BPP normal    Assessment and Plan  1. NST (non-stress test) reactive - BPP 8/8 - Reassurance given that NST and BPP were WNL - Explained FKC  2. [redacted] weeks gestation of pregnancy   - Discharge patient - Keep scheduled appt with [facility] on 03/20/2023 - Patient verbalized an understanding of the plan of care and agrees.   Raelyn Mora, CNM 03/13/2023, 1:41 PM

## 2023-03-13 NOTE — MAU Note (Signed)
.  Sarah Koch is a 32 y.o. at [redacted]w[redacted]d here in MAU reporting: sent from office for non reactive NST and decreased fetal movement. Denies any pain or cramping. No bleeding or discharge reported at this time.  LMP:  Onset of complaint: today  Pain score: 0 Vitals:   03/13/23 1249  BP: 113/71  Pulse: (!) 113  Resp: 18  Temp: 98.2 F (36.8 C)     FHT:143 Lab orders placed from triage:

## 2023-03-13 NOTE — Progress Notes (Signed)
Pt presents for ROB 36 week labs collected Reports elevated post prandial BG readings

## 2023-03-14 LAB — CERVICOVAGINAL ANCILLARY ONLY
Chlamydia: NEGATIVE
Comment: NEGATIVE
Comment: NORMAL
Neisseria Gonorrhea: NEGATIVE

## 2023-03-17 LAB — CULTURE, BETA STREP (GROUP B ONLY): Strep Gp B Culture: NEGATIVE

## 2023-03-20 ENCOUNTER — Ambulatory Visit (INDEPENDENT_AMBULATORY_CARE_PROVIDER_SITE_OTHER): Payer: Medicaid Other | Admitting: Obstetrics & Gynecology

## 2023-03-20 VITALS — BP 104/72 | HR 96 | Wt 218.0 lb

## 2023-03-20 DIAGNOSIS — Z603 Acculturation difficulty: Secondary | ICD-10-CM

## 2023-03-20 DIAGNOSIS — Z758 Other problems related to medical facilities and other health care: Secondary | ICD-10-CM

## 2023-03-20 DIAGNOSIS — O24410 Gestational diabetes mellitus in pregnancy, diet controlled: Secondary | ICD-10-CM

## 2023-03-20 DIAGNOSIS — O99210 Obesity complicating pregnancy, unspecified trimester: Secondary | ICD-10-CM

## 2023-03-20 DIAGNOSIS — O0993 Supervision of high risk pregnancy, unspecified, third trimester: Secondary | ICD-10-CM

## 2023-03-20 NOTE — Progress Notes (Signed)
   PRENATAL VISIT NOTE  Subjective:  Sarah Koch is a 32 y.o. G3P1011 at [redacted]w[redacted]d being seen today for ongoing prenatal care.  She is currently monitored for the following issues for this high-risk pregnancy and has Obesity in pregnancy; Gestational diabetes mellitus (GDM) in third trimester; Language barrier; Supervision of high-risk pregnancy; and History of gestational diabetes in prior pregnancy, currently pregnant on their problem list.  Patient reports no complaints.  Contractions: Not present. Vag. Bleeding: None.  Movement: Present. Denies leaking of fluid.   The following portions of the patient's history were reviewed and updated as appropriate: allergies, current medications, past family history, past medical history, past social history, past surgical history and problem list.   Objective:   Vitals:   03/20/23 1008  BP: 104/72  Pulse: 96  Weight: 218 lb (98.9 kg)    Fetal Status: Fetal Heart Rate (bpm): 140   Movement: Present     General:  Alert, oriented and cooperative. Patient is in no acute distress.  Skin: Skin is warm and dry. No rash noted.   Cardiovascular: Normal heart rate noted  Respiratory: Normal respiratory effort, no problems with respiration noted  Abdomen: Soft, gravid, appropriate for gestational age.  Pain/Pressure: Absent     Pelvic: Cervical exam deferred        Extremities: Normal range of motion.     Mental Status: Normal mood and affect. Normal behavior. Normal judgment and thought content.   Assessment and Plan:  Pregnancy: G3P1011 at [redacted]w[redacted]d 1. Diet controlled gestational diabetes mellitus (GDM) in third trimester FBS up to 110, pp most<130 On Metformin 2. Language barrier Tigrinian  3. Obesity in pregnancy Body mass index is 41.19 kg/m.   4. Supervision of high risk pregnancy in third trimester IOL 39 weeks  Term labor symptoms and general obstetric precautions including but not limited to vaginal bleeding, contractions, leaking of  fluid and fetal movement were reviewed in detail with the patient. Please refer to After Visit Summary for other counseling recommendations.   Return in about 1 week (around 03/27/2023).  Future Appointments  Date Time Provider Department Center  03/21/2023  9:30 AM [facility] NURSE [facility]  03/21/2023  9:45 AM [facility]  03/28/2023  9:55 AM Corlis Hove, NP [facility] None  04/04/2023 10:35 AM Warden Fillers, MD [facility] None  04/10/2023 10:35 AM Constant, Gigi Gin, MD [facility] None    Scheryl Darter, MD

## 2023-03-20 NOTE — Progress Notes (Signed)
Pt has u/s tomorrow. Pt has glucose log with her today.

## 2023-03-21 ENCOUNTER — Ambulatory Visit: Payer: Medicaid Other | Admitting: *Deleted

## 2023-03-21 ENCOUNTER — Ambulatory Visit: Payer: Medicaid Other | Attending: Obstetrics and Gynecology | Admitting: *Deleted

## 2023-03-21 VITALS — BP 107/78 | HR 96

## 2023-03-21 DIAGNOSIS — Z3A37 37 weeks gestation of pregnancy: Secondary | ICD-10-CM | POA: Diagnosis not present

## 2023-03-21 DIAGNOSIS — O24419 Gestational diabetes mellitus in pregnancy, unspecified control: Secondary | ICD-10-CM

## 2023-03-21 DIAGNOSIS — O0993 Supervision of high risk pregnancy, unspecified, third trimester: Secondary | ICD-10-CM

## 2023-03-21 DIAGNOSIS — O24415 Gestational diabetes mellitus in pregnancy, controlled by oral hypoglycemic drugs: Secondary | ICD-10-CM | POA: Insufficient documentation

## 2023-03-21 NOTE — Procedures (Signed)
Sarah Koch 1990/12/03 [redacted]w[redacted]d  Fetus A Non-Stress Test Interpretation for 03/21/23  Indication: Gestational Diabetes medication controlled  Fetal Heart Rate A Mode: External Baseline Rate (A): 140 bpm Variability: Moderate Accelerations: 15 x 15 Decelerations: None Multiple birth?: No  Uterine Activity Mode: Toco Contraction Frequency (min): none Resting Tone Palpated: Relaxed  Interpretation (Fetal Testing) Nonstress Test Interpretation: Reactive Overall Impression: Reassuring for gestational age Comments: Tracing reviewed byDr. Parke Poisson

## 2023-03-28 ENCOUNTER — Inpatient Hospital Stay (HOSPITAL_COMMUNITY)
Admission: AD | Admit: 2023-03-28 | Discharge: 2023-03-31 | DRG: 807 | Disposition: A | Discharge status: Home / Self Care | Payer: Medicaid Other | Attending: Family Medicine | Admitting: Family Medicine

## 2023-03-28 ENCOUNTER — Ambulatory Visit (INDEPENDENT_AMBULATORY_CARE_PROVIDER_SITE_OTHER): Payer: Medicaid Other | Admitting: Student

## 2023-03-28 ENCOUNTER — Encounter (HOSPITAL_COMMUNITY): Payer: Self-pay | Admitting: Obstetrics & Gynecology

## 2023-03-28 VITALS — BP 112/76 | HR 113 | Wt 218.6 lb

## 2023-03-28 DIAGNOSIS — O99214 Obesity complicating childbirth: Secondary | ICD-10-CM | POA: Diagnosis present

## 2023-03-28 DIAGNOSIS — Z758 Other problems related to medical facilities and other health care: Secondary | ICD-10-CM

## 2023-03-28 DIAGNOSIS — O99210 Obesity complicating pregnancy, unspecified trimester: Secondary | ICD-10-CM

## 2023-03-28 DIAGNOSIS — O99824 Streptococcus B carrier state complicating childbirth: Secondary | ICD-10-CM | POA: Diagnosis present

## 2023-03-28 DIAGNOSIS — O24415 Gestational diabetes mellitus in pregnancy, controlled by oral hypoglycemic drugs: Secondary | ICD-10-CM

## 2023-03-28 DIAGNOSIS — O24425 Gestational diabetes mellitus in childbirth, controlled by oral hypoglycemic drugs: Principal | ICD-10-CM | POA: Diagnosis present

## 2023-03-28 DIAGNOSIS — O0993 Supervision of high risk pregnancy, unspecified, third trimester: Secondary | ICD-10-CM

## 2023-03-28 DIAGNOSIS — O24424 Gestational diabetes mellitus in childbirth, insulin controlled: Secondary | ICD-10-CM | POA: Diagnosis not present

## 2023-03-28 DIAGNOSIS — Z3A38 38 weeks gestation of pregnancy: Secondary | ICD-10-CM | POA: Diagnosis not present

## 2023-03-28 DIAGNOSIS — O0990 Supervision of high risk pregnancy, unspecified, unspecified trimester: Secondary | ICD-10-CM

## 2023-03-28 DIAGNOSIS — Z603 Acculturation difficulty: Secondary | ICD-10-CM | POA: Diagnosis present

## 2023-03-28 DIAGNOSIS — O24419 Gestational diabetes mellitus in pregnancy, unspecified control: Secondary | ICD-10-CM | POA: Diagnosis present

## 2023-03-28 LAB — CBC
HCT: 40.5 % (ref 36.0–46.0)
Hemoglobin: 13 g/dL (ref 12.0–15.0)
MCH: 26.2 pg (ref 26.0–34.0)
MCHC: 32.1 g/dL (ref 30.0–36.0)
MCV: 81.5 fL (ref 80.0–100.0)
Platelets: 194 10*3/uL (ref 150–400)
RBC: 4.97 MIL/uL (ref 3.87–5.11)
RDW: 15.2 % (ref 11.5–15.5)
WBC: 8.6 10*3/uL (ref 4.0–10.5)
nRBC: 0 % (ref 0.0–0.2)

## 2023-03-28 LAB — TYPE AND SCREEN
ABO/RH(D): B POS
Antibody Screen: NEGATIVE

## 2023-03-28 MED ORDER — FENTANYL CITRATE (PF) 100 MCG/2ML IJ SOLN
100.0000 ug | INTRAMUSCULAR | Status: DC | PRN
  Administered 2023-03-29 (×2): 100 ug via INTRAVENOUS
  Filled 2023-03-28 (×2): qty 2

## 2023-03-28 MED ORDER — OXYTOCIN-SODIUM CHLORIDE 30-0.9 UT/500ML-% IV SOLN
2.5000 [IU]/h | INTRAVENOUS | Status: DC
  Filled 2023-03-28: qty 500

## 2023-03-28 MED ORDER — OXYCODONE-ACETAMINOPHEN 5-325 MG PO TABS: 1.0000 | ORAL_TABLET | ORAL | Status: DC | PRN

## 2023-03-28 MED ORDER — SOD CITRATE-CITRIC ACID 500-334 MG/5ML PO SOLN: 30.0000 mL | ORAL | Status: DC | PRN

## 2023-03-28 MED ORDER — MISOPROSTOL 50MCG HALF TABLET
50.0000 ug | ORAL_TABLET | Freq: Once | ORAL | Status: AC
  Administered 2023-03-29: 50 ug via ORAL
  Filled 2023-03-28: qty 1

## 2023-03-28 MED ORDER — LACTATED RINGERS IV SOLN: 500.0000 mL | INTRAVENOUS | Status: DC | PRN

## 2023-03-28 MED ORDER — OXYCODONE-ACETAMINOPHEN 5-325 MG PO TABS: 2.0000 | ORAL_TABLET | ORAL | Status: DC | PRN

## 2023-03-28 MED ORDER — OXYTOCIN BOLUS FROM INFUSION
333.0000 mL | Freq: Once | INTRAVENOUS | Status: AC
  Administered 2023-03-29: 333 mL via INTRAVENOUS

## 2023-03-28 MED ORDER — LACTATED RINGERS IV SOLN: INTRAVENOUS | Status: DC

## 2023-03-28 MED ORDER — LIDOCAINE HCL (PF) 1 % IJ SOLN: 30.0000 mL | INTRAMUSCULAR | Status: DC | PRN

## 2023-03-28 MED ORDER — ACETAMINOPHEN 325 MG PO TABS: 650.0000 mg | ORAL_TABLET | ORAL | Status: DC | PRN

## 2023-03-28 MED ORDER — TERBUTALINE SULFATE 1 MG/ML IJ SOLN: 0.2500 mg | Freq: Once | INTRAMUSCULAR | Status: DC | PRN

## 2023-03-28 MED ORDER — METFORMIN HCL ER 500 MG PO TB24
1000.0000 mg | ORAL_TABLET | Freq: Every day | ORAL | Status: DC
  Administered 2023-03-29: 1000 mg via ORAL
  Filled 2023-03-28 (×2): qty 2

## 2023-03-28 MED ORDER — MISOPROSTOL 25 MCG QUARTER TABLET
25.0000 ug | ORAL_TABLET | Freq: Once | ORAL | Status: AC
  Administered 2023-03-29: 25 ug via VAGINAL
  Filled 2023-03-28: qty 1

## 2023-03-28 MED ORDER — ONDANSETRON HCL 4 MG/2ML IJ SOLN: 4.0000 mg | Freq: Four times a day (QID) | INTRAMUSCULAR | Status: DC | PRN

## 2023-03-28 NOTE — Progress Notes (Signed)
   PRENATAL VISIT NOTE  Subjective:  Sarah Koch is a 32 y.o. G3P1011 at [redacted]w[redacted]d being seen today for ongoing prenatal care.  She is currently monitored for the following issues for this high-risk pregnancy and has Obesity in pregnancy; Gestational diabetes mellitus (GDM) in third trimester; Language barrier; Supervision of high-risk pregnancy; History of gestational diabetes in prior pregnancy, currently pregnant; GDM, class A2; and Vaginal delivery on their problem list.  Patient reports no complaints.  Contractions: Not present. Vag. Bleeding: None.  Movement: Present. Denies leaking of fluid.   The following portions of the patient's history were reviewed and updated as appropriate: allergies, current medications, past family history, past medical history, past social history, past surgical history and problem list.   Objective:   Vitals:   03/28/23 1010  BP: 112/76  Pulse: (!) 113  Weight: 218 lb 9.6 oz (99.2 kg)    Fetal Status: Fetal Heart Rate (bpm): 143   Movement: Present     General:  Alert, oriented and cooperative. Patient is in no acute distress.  Skin: Skin is warm and dry. No rash noted.   Cardiovascular: Normal heart rate noted  Respiratory: Normal respiratory effort, no problems with respiration noted  Abdomen: Soft, gravid, appropriate for gestational age.  Pain/Pressure: Absent     Pelvic: Cervical exam deferred        Extremities: Normal range of motion.  Edema: Trace  Mental Status: Normal mood and affect. Normal behavior. Normal judgment and thought content.   Assessment and Plan:  Pregnancy: G3P1011 at [redacted]w[redacted]d 1. Supervision of high risk pregnancy in third trimester - Frequent and vigorous fetal movement  2. Language barrier - iPAD utilized throughout visit   3. Obesity in pregnancy - BMI:   4. Gestational diabetes mellitus (GDM) in third trimester controlled on oral hypoglycemic drug - on Metformin  - Blood sugars :  FBS: 118, 105, 120, 103, 125 PP :  122, 143,148, 132, 152, 124, 125, 125, 130, 152, 225 - Due to uncontrolled blood sugars at 38 weeks and missed antenatal appointments, recommend patient present to hospital for IOL. Dr. Berton Lan in agreement with plan   5. [redacted] weeks gestation of pregnancy - plan for delivery   Term labor symptoms and general obstetric precautions including but not limited to vaginal bleeding, contractions, leaking of fluid and fetal movement were reviewed in detail with the patient. Please refer to After Visit Summary for other counseling recommendations.   No future appointments.   Corlis Hove, NP

## 2023-03-28 NOTE — MAU Note (Signed)
Pt informed that the ultrasound is considered a limited OB ultrasound and is not intended to be a complete ultrasound exam.  Patient also informed that the ultrasound is not being completed with the intent of assessing for fetal or placental anomalies or any pelvic abnormalities.  Explained that the purpose of today's ultrasound is to assess for  presentation.  Patient acknowledges the purpose of the exam and the limitations of the study.    Verified Vertex.    

## 2023-03-28 NOTE — MAU Note (Signed)
..  Sarah Koch is a 32 y.o. at [redacted]w[redacted]d here in MAU reporting: here for direct admit for GDM. Denies pain, vaginal bleeding or leaking of fluid. +FM  Pain score: 0/10 Vitals:   03/28/23 2052  BP: 92/67  Pulse: 96  Resp: 17  Temp: 98.1 F (36.7 C)  SpO2: 100%     FHT:140 Lab orders placed from triage:  none

## 2023-03-28 NOTE — H&P (Cosign Needed Addendum)
Sarah Koch is a 32 y.o. G23P1011 female at [redacted]w[redacted]d by 9.1wk u/s presenting for IOL due to uncontrolled GDMA2.   Reports active fetal movement, contractions: irreg, mild;  vaginal bleeding: none, membranes: intact.  Initiated prenatal care at [facility] at 15.2 wks.   Most recent u/s : [redacted]w[redacted]d, EFW 91% (6+12), AFI 9.6cm, post placenta, cephalic.   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  Drysol was ineffective.  Await lab results.  If not pregnant, would consider a trial of oxybutynin 5 mg 3 times daily.   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   Varicose vein of leg 01/04/2016    Past Surgical History: Past Surgical History:  Procedure Laterality Date   NO PAST SURGERIES      Obstetrical History: OB History     Gravida  3   Para  1   Term  1   Preterm      AB  1   Living  1      SAB  1   IAB      Ectopic      Multiple  0   Live Births  1           Social History: Social History   Socioeconomic History   Marital status: Single    Spouse name: Not on file   Number of children: Not on file   Years of education: Not on file   Highest education level: Not on file  Occupational History   Not on file  Tobacco Use   Smoking status: Never   Smokeless tobacco: Never  Vaping Use   Vaping Use: Never used  Substance and Sexual Activity   Alcohol use: No    Alcohol/week: 0.0 standard drinks of alcohol   Drug use: No   Sexual activity: Not Currently     Partners: Male    Birth control/protection: None  Other Topics Concern   Not on file  Social History Narrative   Not on file   Social Determinants of Health   Financial Resource Strain: Not on file  Food Insecurity: No Food Insecurity (03/28/2023)   Hunger Vital Sign    Worried About Running Out of Food in the Last Year: Never true    Ran Out of Food in the Last Year: Never true  Transportation Needs: No Transportation Needs (03/28/2023)   PRAPARE - Administrator, Civil Service (Medical): No    Lack of Transportation (Non-Medical): No  Physical Activity: Not on file  Stress: Not on file  Social Connections: Not on file    Family History: Family History  Problem Relation Age of Onset   Asthma Neg Hx    Cancer Neg Hx    Diabetes Neg Hx    Heart disease Neg Hx    Hypertension Neg Hx     Allergies: Allergies  Allergen Reactions   Pork-Derived Products     Medications Prior to Admission  Medication Sig Dispense Refill Last Dose   metFORMIN (GLUCOPHAGE-XR) 500 MG 24  hr tablet Take 2 tablets (1,000 mg total) by mouth at bedtime. 60 tablet 1 03/28/2023 at 1100   Prenat-Fe Poly-Methfol-FA-DHA (VITAFOL ULTRA) 29-0.6-0.4-200 MG CAPS Take 1 capsule by mouth daily. 30 capsule 12 03/28/2023   acetaminophen (TYLENOL) 500 MG tablet Take 2 tablets (1,000 mg total) by mouth every 8 (eight) hours as needed. 60 tablet 0    ferrous sulfate 324 MG TBEC Take 324 mg by mouth.      glucose blood (ACCU-CHEK GUIDE) test strip Use to check blood sugars four times a day was instructed 50 each 12    polyethylene glycol (MIRALAX / GLYCOLAX) 17 g packet Take 17 g by mouth daily. (Patient not taking: Reported on 12/07/2022) 14 each 0    Prenatal Vit-Fe Fumarate-FA (PREPLUS) 27-1 MG TABS Take 1 tablet by mouth daily. (Patient not taking: Reported on 02/22/2023) 30 tablet 13     Review of Systems  Pertinent pos/neg as indicated in HPI  Blood pressure 113/72, pulse 92, temperature 98.5 F (36.9  C), temperature source Oral, resp. rate 20, height 5\' 2"  (1.575 m), weight 99.2 kg, last menstrual period 06/26/2022, SpO2 99 %, currently breastfeeding. General appearance: alert, cooperative, and no distress Lungs: clear to auscultation bilaterally Heart: regular rate and rhythm Abdomen: gravid, soft, non-tender, EFW by Leopold's approximately 8lbs Extremities: tr edema  Fetal monitoring: FHR: 140s bpm, variability: moderate,  Accelerations: Present,  decelerations:  Absent Uterine activity: irreg, mild Dilation: Closed Effacement (%): Thick Station: -3 Exam by:: Esmeralda Arthur RN Presentation: cephalic   Prenatal labs: ABO, Rh: --/--/B POS (07/10 2211) Antibody: NEG (07/10 2211) Rubella: 4.72 (12/19 1425) RPR: Non Reactive (05/13 0823)  HBsAg: Negative (12/19 1425)  HIV: Non Reactive (05/13 0823)  GBS: Negative/-- (06/25 1137)  2hr GTT: 114/234/180  Prenatal Transfer Tool  Maternal Diabetes: Yes:  Diabetes Type:  Insulin/Medication controlled Genetic Screening: Normal Maternal Ultrasounds/Referrals: Normal Fetal Ultrasounds or other Referrals:  Referred to Materal Fetal Medicine  Maternal Substance Abuse:  No Significant Maternal Medications:  Meds include: Other: Metformin 1gm qhs Significant Maternal Lab Results: Group B Strep negative  Results for orders placed or performed during the hospital encounter of 03/28/23 (from the past 24 hour(s))  CBC   Collection Time: 03/28/23 10:11 PM  Result Value Ref Range   WBC 8.6 4.0 - 10.5 K/uL   RBC 4.97 3.87 - 5.11 MIL/uL   Hemoglobin 13.0 12.0 - 15.0 g/dL   HCT 19.1 47.8 - 29.5 %   MCV 81.5 80.0 - 100.0 fL   MCH 26.2 26.0 - 34.0 pg   MCHC 32.1 30.0 - 36.0 g/dL   RDW 62.1 30.8 - 65.7 %   Platelets 194 150 - 400 K/uL   nRBC 0.0 0.0 - 0.2 %  Type and screen [facility]   Collection Time: 03/28/23 10:11 PM  Result Value Ref Range   ABO/RH(D) B POS    Antibody Screen NEG    Sample Expiration       03/31/2023,2359 Performed at [facility], [address], [place], [place] [address]   Glucose, capillary   Collection Time: 03/29/23 12:34 AM  Result Value Ref Range   Glucose-Capillary 80 70 - 99 mg/dL     Assessment:  [redacted]w[redacted]d SIUP  G3P1011  GDMA2-uncontrolled  Cat 1 FHR  GBS Negative/-- (06/25 1137)  Plan:  Admit to L&D  IV pain meds/epidural prn active labor  Cytotec dual dose to start, then buccal q 4h until able to place cervical foley;  then Pit/AROM prn  CBGs q 4h in latent then q 2h in active labor; continue home Metformin 1g daily dose, and use endotool prn values consistently >120  Anticipate vaginal delivery   Plans to breastfeed  Contraception: declines  Circumcision: yes  Arabella Merles CNM 03/29/2023, 12:55 AM

## 2023-03-29 ENCOUNTER — Inpatient Hospital Stay (HOSPITAL_COMMUNITY): Payer: Medicaid Other | Admitting: Anesthesiology

## 2023-03-29 ENCOUNTER — Other Ambulatory Visit: Payer: Self-pay

## 2023-03-29 DIAGNOSIS — O24424 Gestational diabetes mellitus in childbirth, insulin controlled: Secondary | ICD-10-CM

## 2023-03-29 DIAGNOSIS — O99214 Obesity complicating childbirth: Secondary | ICD-10-CM

## 2023-03-29 DIAGNOSIS — Z3A38 38 weeks gestation of pregnancy: Secondary | ICD-10-CM

## 2023-03-29 LAB — GLUCOSE, CAPILLARY
Glucose-Capillary: 101 mg/dL — ABNORMAL HIGH (ref 70–99)
Glucose-Capillary: 71 mg/dL (ref 70–99)
Glucose-Capillary: 80 mg/dL (ref 70–99)
Glucose-Capillary: 82 mg/dL (ref 70–99)
Glucose-Capillary: 85 mg/dL (ref 70–99)
Glucose-Capillary: 95 mg/dL (ref 70–99)

## 2023-03-29 LAB — RPR: RPR Ser Ql: NONREACTIVE

## 2023-03-29 MED ORDER — LOPERAMIDE HCL 2 MG PO CAPS
2.0000 mg | ORAL_CAPSULE | Freq: Once | ORAL | Status: AC
  Administered 2023-03-29: 2 mg via ORAL
  Filled 2023-03-29: qty 1

## 2023-03-29 MED ORDER — MISOPROSTOL 50MCG HALF TABLET
50.0000 ug | ORAL_TABLET | ORAL | Status: DC
  Administered 2023-03-29 (×3): 50 ug via BUCCAL
  Filled 2023-03-29 (×3): qty 1

## 2023-03-29 MED ORDER — OXYTOCIN-SODIUM CHLORIDE 30-0.9 UT/500ML-% IV SOLN
1.0000 m[IU]/min | INTRAVENOUS | Status: DC
  Administered 2023-03-29: 2 m[IU]/min via INTRAVENOUS

## 2023-03-29 MED ORDER — LIDOCAINE HCL (PF) 1 % IJ SOLN
INTRAMUSCULAR | Status: DC | PRN
  Administered 2023-03-29: 10 mL via EPIDURAL

## 2023-03-29 MED ORDER — EPHEDRINE 5 MG/ML INJ: 10.0000 mg | INTRAVENOUS | Status: DC | PRN

## 2023-03-29 MED ORDER — FENTANYL-BUPIVACAINE-NACL 0.5-0.125-0.9 MG/250ML-% EP SOLN
12.0000 mL/h | EPIDURAL | Status: DC | PRN
  Administered 2023-03-29: 12 mL/h via EPIDURAL
  Filled 2023-03-29: qty 250

## 2023-03-29 MED ORDER — PHENYLEPHRINE 80 MCG/ML (10ML) SYRINGE FOR IV PUSH (FOR BLOOD PRESSURE SUPPORT): 80.0000 ug | PREFILLED_SYRINGE | INTRAVENOUS | Status: DC | PRN

## 2023-03-29 MED ORDER — LACTATED RINGERS IV SOLN
500.0000 mL | Freq: Once | INTRAVENOUS | Status: AC
  Administered 2023-03-29: 500 mL via INTRAVENOUS

## 2023-03-29 MED ORDER — DIPHENHYDRAMINE HCL 50 MG/ML IJ SOLN: 12.5000 mg | INTRAMUSCULAR | Status: DC | PRN

## 2023-03-29 NOTE — Discharge Summary (Signed)
Postpartum Discharge Summary  Date of Service updated     Patient Name: Sarah Koch DOB: 09-01-1991 MRN: [id number]  Date of admission: 03/28/2023 Delivery date:03/29/2023 Delivering provider: Jacklyn Shell Date of discharge: 03/31/2023  Admitting diagnosis: GDM, class A2 [O24.419] Intrauterine pregnancy: [redacted]w[redacted]d     Secondary diagnosis:  Principal Problem:   GDM, class A2 Active Problems:   Obesity in pregnancy   Gestational diabetes mellitus (GDM) in third trimester   Language barrier   Vaginal delivery  Additional problems: None    Discharge diagnosis: Term Pregnancy Delivered and GDM A2                                              Post partum procedures: None Augmentation: AROM, Pitocin, and IP Foley Complications: None  Hospital course: Induction of Labor With Vaginal Delivery   32 y.o. yo [id number] at [redacted]w[redacted]d was admitted to the hospital 03/28/2023 for induction of labor.  Indication for induction: A2 DM.  Patient had an labor course complicated by nothing Membrane Rupture Time/Date:  ,   Delivery Method:Vaginal, Spontaneous Episiotomy: None Lacerations:  None Details of delivery can be found in separate delivery note.  Patient had an uncomplicated postpartum course. Patient is discharged home 03/31/23.  Newborn Data: Birth date:03/29/2023 Birth time:11:15 PM Gender:Female Living status:Living Apgars:8 ,9  Weight:3310 g  Magnesium Sulfate received: No BMZ received: No Rhophylac:N/A MMR:N/A T-DaP: Declined Flu: N/A Transfusion:No  Physical exam  Vitals:   03/30/23 0230 03/30/23 0845 03/30/23 1429 03/30/23 2209  BP: 108/62 110/69 118/80 125/79  Pulse: 95 89 89 84  Resp: 18 18 18 18   Temp: 98.8 F (37.1 C) 98.3 F (36.8 C) 99 F (37.2 C) 98.6 F (37 C)  TempSrc: Oral Oral Oral Oral  SpO2: 99% 99%  99%  Weight:      Height:       General: alert, cooperative, and no distress Lochia: appropriate Uterine Fundus: firm Incision: N/A DVT  Evaluation: No evidence of DVT seen on physical exam. Labs: Lab Results  Component Value Date   WBC 8.6 03/28/2023   HGB 13.0 03/28/2023   HCT 40.5 03/28/2023   MCV 81.5 03/28/2023   PLT 194 03/28/2023      Latest Ref Rng & Units 03/31/2023    5:34 AM  CMP  Glucose 70 - 99 mg/dL 308    Edinburgh Score:    03/30/2023    8:45 AM  Edinburgh Postnatal Depression Scale Screening Tool  I have been able to laugh and see the funny side of things. 0  I have looked forward with enjoyment to things. 0  I have blamed myself unnecessarily when things went wrong. 0  I have been anxious or worried for no good reason. 0  I have felt scared or panicky for no good reason. 0  Things have been getting on top of me. 0  I have been so unhappy that I have had difficulty sleeping. 0  I have felt sad or miserable. 0  I have been so unhappy that I have been crying. 0  The thought of harming myself has occurred to me. 0  Edinburgh Postnatal Depression Scale Total 0     After visit meds:  Allergies as of 03/31/2023       Reactions   Pork-derived Products         Medication  List     STOP taking these medications    Accu-Chek Guide test strip Generic drug: glucose blood   ferrous sulfate 324 MG Tbec   metFORMIN 500 MG 24 hr tablet Commonly known as: GLUCOPHAGE-XR   polyethylene glycol 17 g packet Commonly known as: MIRALAX / GLYCOLAX   Vitafol Ultra 29-0.6-0.4-200 MG Caps       TAKE these medications    acetaminophen 325 MG tablet Commonly known as: Tylenol Take 2 tablets (650 mg total) by mouth every 4 (four) hours as needed (for pain scale < 4). What changed:  medication strength how much to take when to take this reasons to take this   ibuprofen 600 MG tablet Commonly known as: ADVIL Take 1 tablet (600 mg total) by mouth every 6 (six) hours.   PrePLUS 27-1 MG Tabs Take 1 tablet by mouth daily.         Discharge home in stable condition Infant Feeding:  Bottle Infant Disposition:home with mother Discharge instruction: per After Visit Summary and Postpartum booklet. Activity: Advance as tolerated. Pelvic rest for 6 weeks.  Diet: routine diet Future Appointments:No future appointments. Follow up Visit:  Follow-up Information     [facility] Follow up in 6 week(s).   Specialty: Obstetrics and Gynecology Contact information: [address] [place] [address] [phone]                 Please schedule this patient for a In person postpartum visit in 4 weeks with the following provider: Any provider. Additional Postpartum F/U:2 hour GTT  Low risk pregnancy complicated by: GDM Delivery mode:  Vaginal, Spontaneous Anticipated Birth Control:   delinces   03/31/2023 Milas Hock, MD

## 2023-03-29 NOTE — Progress Notes (Signed)
Late Entry d/t patient acuity: Sarah Koch is a 32 y.o. G3P1011 at [redacted]w[redacted]d by ultrasound admitted for induction of labor due to Gestational diabetes uncontrolled on Metformin. .  Subjective: Patient doing well. Introductions exchanged.   Objective: BP 113/69 (BP Location: Right Arm)   Pulse 94   Temp 97.8 F (36.6 C) (Oral)   Resp 17   Ht 5\' 2"  (1.575 m)   Wt 99.2 kg   LMP 06/26/2022 (Exact Date)   SpO2 99%   BMI 39.98 kg/m  No intake/output data recorded. No intake/output data recorded.  FHT:  FHR: 150 bpm, variability: moderate,  accelerations:  Present,  decelerations:  Absent UC:   irregular, every 5-10 minutes SVE:   Dilation: Fingertip Effacement (%): Thick Station: -2 Exam by:: Beryle Beams, RN  Labs: Lab Results  Component Value Date   WBC 8.6 03/28/2023   HGB 13.0 03/28/2023   HCT 40.5 03/28/2023   MCV 81.5 03/28/2023   PLT 194 03/28/2023  \ CBG (last 3)  Recent Labs    03/29/23 0034 03/29/23 0456 03/29/23 0858  GLUCAP 80 85 71     Assessment / Plan: Induction of labor due to gestational diabetes,  s/p dual cytotec.   Labor:  Minimal cervical change at this time. Repeat Cytotec and then assess for FB.  Fetal Wellbeing:  Category I Pain Control:  Labor support without medications at this time. Patient planning epidural and may have upon request.  I/D:   GBS negative  Anticipated MOD:  NSVD  Claudette Head, CNM 03/29/2023, 10:25 AM

## 2023-03-29 NOTE — Anesthesia Procedure Notes (Signed)
Epidural Patient location during procedure: OB Start time: 03/29/2023 9:35 PM End time: 03/29/2023 9:40 PM  Staffing Anesthesiologist: Leilani Able, MD Performed: anesthesiologist   Preanesthetic Checklist Completed: patient identified, IV checked, site marked, risks and benefits discussed, surgical consent, monitors and equipment checked, pre-op evaluation and timeout performed  Epidural Patient position: sitting Prep: DuraPrep and site prepped and draped Patient monitoring: continuous pulse ox and blood pressure Approach: midline Location: L3-L4 Injection technique: LOR air  Needle:  Needle type: Tuohy  Needle gauge: 17 G Needle length: 9 cm and 9 Needle insertion depth: 6 cm Catheter type: closed end flexible Catheter size: 19 Gauge Catheter at skin depth: 11 cm Test dose: negative and Other  Assessment Events: blood not aspirated, no cerebrospinal fluid, injection not painful, no injection resistance, no paresthesia and negative IV test  Additional Notes Reason for block:procedure for pain

## 2023-03-29 NOTE — Progress Notes (Signed)
Sarah Koch is a 32 y.o. G3P1011 at [redacted]w[redacted]d by ultrasound admitted for induction of labor due to Gestational diabetes.   Subjective: Patient doing well.   Objective: BP 103/80   Pulse 96   Temp (!) 97.5 F (36.4 C) (Oral)   Resp 17   Ht 5\' 2"  (1.575 m)   Wt 99.2 kg   LMP 06/26/2022 (Exact Date)   SpO2 99%   BMI 39.98 kg/m  No intake/output data recorded. No intake/output data recorded.  FHT:  FHR: 135 bpm, variability: moderate,  accelerations:  Present,  decelerations:  Absent UC:   regular, every 2-7 minutes SVE:   Dilation: 4 Effacement (%): 70 Station: -2 Exam by:: Beryle Beams, RN  Labs: Lab Results  Component Value Date   WBC 8.6 03/28/2023   HGB 13.0 03/28/2023   HCT 40.5 03/28/2023   MCV 81.5 03/28/2023   PLT 194 03/28/2023   CBG (last 3)  Recent Labs    03/29/23 0456 03/29/23 0858 03/29/23 1309  GLUCAP 85 71 95     Assessment / Plan: Induction of labor due to gestational diabetes s/p cytotec and FB.   Labor:  Designer, television/film set and continue to titrate up as needed.  Fetal Wellbeing:  Category I Pain Control:  Epidural- patient may have epidural upon request I/D:   GBS Neg Anticipated MOD:  NSVD  Claudette Head, CNM 03/29/2023, 5:54 PM

## 2023-03-29 NOTE — Progress Notes (Signed)
Patient ID: Sarah Koch, female   DOB: 10/19/1990, 32 y.o.   MRN: [id number]  Strip note: S/p dual dose cytotec followed by buccal cytotec at 0500  BPs 111/76, 113/72 FHR 130s, variability a little less than previous Ctx irreg 1-6 mins Cx deferred (was closed)  CBGs: 85, 80  IUP@38 .3wks Cx unfavorable GDMA2- uncontrolled  At 0900 plan for cx exam and possible cervical foley and/or repeat cytotec dose Continue q 4h CBGs in latent phase and q 2h in active Anticipate vag delivery  Arabella Merles CNM 03/29/2023 8:05 AM

## 2023-03-29 NOTE — Progress Notes (Signed)
Sarah Koch is a 32 y.o. G3P1011 at [redacted]w[redacted]d by ultrasound admitted for induction of labor due to Gestational diabetes.  Subjective: Patient doing well. Up moving around the room   Objective: BP 119/74   Pulse 100   Temp (!) 97.5 F (36.4 C) (Oral)   Resp 17   Ht 5\' 2"  (1.575 m)   Wt 99.2 kg   LMP 06/26/2022 (Exact Date)   SpO2 99%   BMI 39.98 kg/m  No intake/output data recorded. No intake/output data recorded.  FHT:  FHR: 135 bpm, variability: moderate,  accelerations:  Present,  decelerations:  Absent UC:   irregular, every 1-5 minutes SVE:   Dilation: 1.5 Effacement (%): 70 Station: -2 Exam by:: Suzie Portela, CNM  Labs: Lab Results  Component Value Date   WBC 8.6 03/28/2023   HGB 13.0 03/28/2023   HCT 40.5 03/28/2023   MCV 81.5 03/28/2023   PLT 194 03/28/2023   CNM to patient bedside to discuss FB placement. Both Risks and benefits discussed. Answered all questions at bedside. Patient agree to proceed with plan of care. IV fentanyl ordered and given to patient. FHT Cat I prior to placement. SVE 1.5/70/-2 S. Suzie Portela CNM, FB cath placed with minimal difficulty and filled to 60cc of water. FHT remained cat I immediately post placement.   CBG (last 3)  Recent Labs    03/29/23 0456 03/29/23 0858 03/29/23 1309  GLUCAP 85 71 95     Assessment / Plan: Induction of labor due to gestational diabetes, s/p cytotec x3.   Labor:  FB placed and repeat dose of 50 buccal cytotec. Upon expulsion assess for Pit 2x2 if needed.  Fetal Wellbeing:  Category I Pain Control:  IV pain meds I/D:   GBS negative  Anticipated MOD:  NSVD  Claudette Head, CNM 03/29/2023, 1:52 PM

## 2023-03-29 NOTE — Anesthesia Preprocedure Evaluation (Signed)
Anesthesia Evaluation  Patient identified by MRN, date of birth, ID band Patient awake    Reviewed: Allergy & Precautions, NPO status , Patient's Chart, lab work & pertinent test results  Airway Mallampati: III       Dental no notable dental hx.    Pulmonary neg pulmonary ROS   Pulmonary exam normal        Cardiovascular negative cardio ROS Normal cardiovascular exam     Neuro/Psych  negative psych ROS   GI/Hepatic negative GI ROS, Neg liver ROS,,,  Endo/Other  diabetes, Gestational, Oral Hypoglycemic Agents  Morbid obesity  Renal/GU negative Renal ROS  negative genitourinary   Musculoskeletal negative musculoskeletal ROS (+)    Abdominal  (+) + obese  Peds negative pediatric ROS (+)  Hematology negative hematology ROS (+)   Anesthesia Other Findings   Reproductive/Obstetrics (+) Pregnancy                             Anesthesia Physical Anesthesia Plan  ASA: 3  Anesthesia Plan: Epidural   Post-op Pain Management:    Induction:   PONV Risk Score and Plan:   Airway Management Planned:   Additional Equipment:   Intra-op Plan:   Post-operative Plan:   Informed Consent: I have reviewed the patients History and Physical, chart, labs and discussed the procedure including the risks, benefits and alternatives for the proposed anesthesia with the patient or authorized representative who has indicated his/her understanding and acceptance.       Plan Discussed with:   Anesthesia Plan Comments:         Anesthesia Quick Evaluation

## 2023-03-30 ENCOUNTER — Encounter (HOSPITAL_COMMUNITY): Payer: Self-pay | Admitting: Obstetrics & Gynecology

## 2023-03-30 LAB — BIRTH TISSUE RECOVERY COLLECTION (PLACENTA DONATION)

## 2023-03-30 MED ORDER — COCONUT OIL OIL: 1.0000 | TOPICAL_OIL | Status: DC | PRN

## 2023-03-30 MED ORDER — SENNOSIDES-DOCUSATE SODIUM 8.6-50 MG PO TABS
2.0000 | ORAL_TABLET | ORAL | Status: DC
  Administered 2023-03-31: 2 via ORAL
  Filled 2023-03-30: qty 2

## 2023-03-30 MED ORDER — PRENATAL MULTIVITAMIN CH
1.0000 | ORAL_TABLET | Freq: Every day | ORAL | Status: DC
  Administered 2023-03-30 – 2023-03-31 (×2): 1 via ORAL
  Filled 2023-03-30 (×2): qty 1

## 2023-03-30 MED ORDER — FERROUS SULFATE 325 (65 FE) MG PO TABS
325.0000 mg | ORAL_TABLET | ORAL | Status: DC
  Administered 2023-03-30: 325 mg via ORAL
  Filled 2023-03-30: qty 1

## 2023-03-30 MED ORDER — METHYLERGONOVINE MALEATE 0.2 MG PO TABS: 0.2000 mg | ORAL_TABLET | ORAL | Status: DC | PRN

## 2023-03-30 MED ORDER — ONDANSETRON HCL 4 MG/2ML IJ SOLN: 4.0000 mg | INTRAMUSCULAR | Status: DC | PRN

## 2023-03-30 MED ORDER — FLEET ENEMA 7-19 GM/118ML RE ENEM: 1.0000 | ENEMA | Freq: Every day | RECTAL | Status: DC | PRN

## 2023-03-30 MED ORDER — SIMETHICONE 80 MG PO CHEW: 80.0000 mg | CHEWABLE_TABLET | ORAL | Status: DC | PRN

## 2023-03-30 MED ORDER — ACETAMINOPHEN 325 MG PO TABS
650.0000 mg | ORAL_TABLET | ORAL | Status: DC | PRN
  Administered 2023-03-30: 650 mg via ORAL
  Filled 2023-03-30: qty 2

## 2023-03-30 MED ORDER — ONDANSETRON HCL 4 MG PO TABS: 4.0000 mg | ORAL_TABLET | ORAL | Status: DC | PRN

## 2023-03-30 MED ORDER — WITCH HAZEL-GLYCERIN EX PADS: 1.0000 | MEDICATED_PAD | CUTANEOUS | Status: DC | PRN

## 2023-03-30 MED ORDER — IBUPROFEN 600 MG PO TABS
600.0000 mg | ORAL_TABLET | Freq: Four times a day (QID) | ORAL | Status: DC
  Administered 2023-03-30 – 2023-03-31 (×5): 600 mg via ORAL
  Filled 2023-03-30 (×6): qty 1

## 2023-03-30 MED ORDER — BENZOCAINE-MENTHOL 20-0.5 % EX AERO: 1.0000 | INHALATION_SPRAY | CUTANEOUS | Status: DC | PRN

## 2023-03-30 MED ORDER — METHYLERGONOVINE MALEATE 0.2 MG/ML IJ SOLN: 0.2000 mg | INTRAMUSCULAR | Status: DC | PRN

## 2023-03-30 MED ORDER — TETANUS-DIPHTH-ACELL PERTUSSIS 5-2.5-18.5 LF-MCG/0.5 IM SUSY: 0.5000 mL | PREFILLED_SYRINGE | Freq: Once | INTRAMUSCULAR | Status: DC

## 2023-03-30 MED ORDER — MEDROXYPROGESTERONE ACETATE 150 MG/ML IM SUSP: 150.0000 mg | INTRAMUSCULAR | Status: DC | PRN

## 2023-03-30 MED ORDER — DIPHENHYDRAMINE HCL 25 MG PO CAPS: 25.0000 mg | ORAL_CAPSULE | Freq: Four times a day (QID) | ORAL | Status: DC | PRN

## 2023-03-30 MED ORDER — DIBUCAINE (PERIANAL) 1 % EX OINT: 1.0000 | TOPICAL_OINTMENT | CUTANEOUS | Status: DC | PRN

## 2023-03-30 MED ORDER — BISACODYL 10 MG RE SUPP: 10.0000 mg | Freq: Every day | RECTAL | Status: DC | PRN

## 2023-03-30 NOTE — Progress Notes (Signed)
POSTPARTUM PROGRESS NOTE  Subjective: Sarah Koch is a 32 y.o. [id number] s/p SVD at [redacted]w[redacted]d.  She reports she is doing well. No acute events overnight. She denies any problems with ambulating, voiding or po intake. Denies nausea or vomiting. Pain is well controlled. Bleeding is minimal.  Objective: Blood pressure 108/62, pulse 95, temperature 98.8 F (37.1 C), temperature source Oral, resp. rate 18, height 5\' 2"  (1.575 m), weight 99.2 kg, last menstrual period 06/26/2022, SpO2 99%, unknown if currently breastfeeding.  Physical Exam:  General: alert, cooperative and no distress Chest: no respiratory distress Abdomen: soft, non-tender  Uterine Fundus: firm and at level of umbilicus Extremities: No calf swelling or tenderness  no LE edema  Recent Labs    03/28/23 2211  HGB 13.0  HCT 40.5    Assessment/Plan: Sarah Koch is a 32 y.o. [id number] s/p SVD at [redacted]w[redacted]d for A2GDM.  Routine Postpartum Care: Doing well, pain well-controlled.  -- Continue routine care, lactation support  -- Contraception: none -- Feeding: breast -- A2GDM: CBGs stable, most recent 82. No DM meds currently.  Dispo: Plan for discharge PPD #2.  Para March, DO Faculty Practice, [facility] for [employer] 03/30/2023 8:52 AM

## 2023-03-30 NOTE — Anesthesia Postprocedure Evaluation (Signed)
Anesthesia Post Note  Patient: Sarah Koch  Procedure(s) Performed: AN AD HOC LABOR EPIDURAL     Patient location during evaluation: Mother Baby Anesthesia Type: Epidural Level of consciousness: awake and alert Pain management: pain level controlled Vital Signs Assessment: post-procedure vital signs reviewed and stable Respiratory status: spontaneous breathing, nonlabored ventilation and respiratory function stable Cardiovascular status: stable Postop Assessment: no headache, no backache, epidural receding, no apparent nausea or vomiting, patient able to bend at knees, able to ambulate and adequate PO intake Anesthetic complications: no   No notable events documented.  Last Vitals:  Vitals:   03/30/23 0120 03/30/23 0230  BP: 115/67 108/62  Pulse: 84 95  Resp: 18 18  Temp: 37.2 C 37.1 C  SpO2: 100% 99%    Last Pain:  Vitals:   03/30/23 0520  TempSrc:   PainSc: 4    Pain Goal:                   Sarah Koch

## 2023-03-30 NOTE — Lactation Note (Signed)
This note was copied from a baby's chart. Lactation Consultation Note  Patient Name: Sarah Koch [id number] Date: 03/30/2023 Age:30 hours Reason for consult: Initial assessment;Late-preterm 34-36.6wks, See Birth Parent MR- GDM in pregnancy. LC used [employer] [phone] Per Birth Parent, infant finished BF prior to [facility] entering the room for 5 minutes and afterwards being supplementing with formula, infant consumed 12 mls of 20 kcal formula. Birth Parent feeding choice is breast and formula feeding, Birth Parent did the same with her 1st child see maternal data below. LC discussed infant's input and output per Birth Parent, Infant had 4 stools and 5 voids since birth. Birth Parent plans to latch infant 1st for every feeding and then supplement infant with formula. Birth Parent knows to BF infant by cues, on demand, 8 to 12+ times within 24 hours, skin to skin. Birth Parent knows to call RN/LC for latch assistance if needed. LC discussed the importance of maternal rest, diet and hydration. Birth Parent was made aware of O/P services, breastfeeding support groups, community resources, and our phone # for post-discharge questions.   Current feeding plan: 1- Birth Parent will continue to BF infant according to hunger cues, on demand, 8 to 12+ times within 24 hours, skin to skin. Birth Parent will work towards increasing infant's duration or length of time at the breast.  2- Birth Parent knows to call RN/LC for latch assistance if needed. 3- Birth Parent knows if infant latch, to offer day 1 ( 5-7 ) mls or more if infant wants it, if infant doesn't latch to offer 10 mls or more per feeding, handout given and explained by interpreter.    Maternal Data Has patient been taught Hand Expression?: Yes Does the patient have breastfeeding experience prior to this delivery?: Yes How long did the patient breastfeed?: Per Birth Parent, she BF and formula fed first  child for 4 months, then switched  to total formula for one year. Her 1st child is currently 63 months old.  Feeding Mother's Current Feeding Choice: Breast Milk and Formula Nipple Type: Slow - flow  LATCH Score                    Lactation Tools Discussed/Used Tools: Pump Breast pump type: Manual Pump Education: Setup, frequency, and cleaning;Milk Storage Reason for Pumping: Given hand pump with 24 mm breast flange by RN  Interventions Interventions: Breast feeding basics reviewed;Skin to skin;Pace feeding;Breast compression  Discharge    Consult Status Consult Status: Follow-up Date: 03/31/23 Follow-up type: In-patient    Frederico Hamman 03/30/2023, 8:56 PM

## 2023-03-31 LAB — GLUCOSE, RANDOM: Glucose, Bld: 123 mg/dL — ABNORMAL HIGH (ref 70–99)

## 2023-03-31 MED ORDER — ACETAMINOPHEN 325 MG PO TABS: 650.0000 mg | ORAL_TABLET | ORAL | 0 refills | Status: AC | PRN

## 2023-03-31 MED ORDER — IBUPROFEN 600 MG PO TABS: 600.0000 mg | ORAL_TABLET | Freq: Four times a day (QID) | ORAL | 0 refills | Status: DC

## 2023-04-04 ENCOUNTER — Encounter: Payer: Medicaid Other | Admitting: Student

## 2023-04-10 ENCOUNTER — Encounter: Payer: Medicaid Other | Admitting: Obstetrics and Gynecology

## 2023-04-23 ENCOUNTER — Telehealth (HOSPITAL_COMMUNITY): Payer: Self-pay | Admitting: *Deleted

## 2023-04-23 NOTE — Telephone Encounter (Signed)
Attempted hospital discharge follow-up call with Language Line interpreter. Automated message received stating, "The person you are trying to reach is unavailable." Deforest Hoyles, RN, 04/23/23, [phone]

## 2023-05-04 ENCOUNTER — Other Ambulatory Visit: Payer: Self-pay | Admitting: Family Medicine

## 2023-05-15 ENCOUNTER — Ambulatory Visit: Payer: Medicaid Other | Admitting: Obstetrics and Gynecology

## 2023-05-15 ENCOUNTER — Other Ambulatory Visit: Payer: Medicaid Other

## 2023-06-19 ENCOUNTER — Ambulatory Visit (INDEPENDENT_AMBULATORY_CARE_PROVIDER_SITE_OTHER): Payer: Medicaid Other | Admitting: Family Medicine

## 2023-06-19 ENCOUNTER — Other Ambulatory Visit: Payer: Medicaid Other

## 2023-06-19 ENCOUNTER — Encounter: Payer: Self-pay | Admitting: Family Medicine

## 2023-06-19 DIAGNOSIS — O0990 Supervision of high risk pregnancy, unspecified, unspecified trimester: Secondary | ICD-10-CM

## 2023-06-19 DIAGNOSIS — Z7689 Persons encountering health services in other specified circumstances: Secondary | ICD-10-CM

## 2023-06-19 DIAGNOSIS — O24415 Gestational diabetes mellitus in pregnancy, controlled by oral hypoglycemic drugs: Secondary | ICD-10-CM

## 2023-06-19 NOTE — Progress Notes (Signed)
Tigrinian Interpreter used throughout visit   Post Partum Visit Note  Sarah Koch is a 32 y.o. G61P2012 female who presents for a postpartum visit. She is  11  weeks postpartum following a normal spontaneous vaginal delivery.  I have fully reviewed the prenatal and intrapartum course. The delivery was at 38 gestational weeks.  Anesthesia: epidural. Postpartum course has been good. Baby is doing well. Baby is feeding by breast and bottle- Similac Advanced . Bleeding no bleeding. Bowel function is normal. Bladder function is normal. Patient is sexually active. Contraception method is none. Postpartum depression screening: negative.   The pregnancy intention screening data noted above was reviewed. Potential methods of contraception were discussed. The patient elected to proceed with No data recorded.   Edinburgh Postnatal Depression Scale - 06/19/23 0919       Edinburgh Postnatal Depression Scale:  In the Past 7 Days   I have been able to laugh and see the funny side of things. 0    I have looked forward with enjoyment to things. 0    I have blamed myself unnecessarily when things went wrong. 0    I have been anxious or worried for no good reason. 0    I have felt scared or panicky for no good reason. 0    Things have been getting on top of me. 0    I have been so unhappy that I have had difficulty sleeping. 0    I have felt sad or miserable. 0    I have been so unhappy that I have been crying. 0    The thought of harming myself has occurred to me. 0    Edinburgh Postnatal Depression Scale Total 0             Health Maintenance Due  Topic Date Due   DTaP/Tdap/Td (1 - Tdap) Never done   INFLUENZA VACCINE  Never done   COVID-19 Vaccine (1 - 2023-24 season) Never done    The following portions of the patient's history were reviewed and updated as appropriate: allergies, current medications, past family history, past medical history, past social history, past surgical history, and  problem list.  Review of Systems Pertinent items are noted in HPI.  Objective:  BP 109/72   Pulse 89   Ht 5\' 2"  (1.575 m)   Wt 213 lb (96.6 kg)   Breastfeeding Yes   BMI 38.96 kg/m    General:  alert, cooperative, and appears stated age   Breasts:  not indicated  Lungs: clear to auscultation bilaterally  Heart:  regular rate and rhythm, S1, S2 normal, no murmur, click, rub or gallop  Abdomen: Flat, nondistended     Wound N/a  GU exam:  not indicated       Assessment:    1. Supervision of high risk pregnancy, antepartum - 2hr GTT  - CBC  - RPR, HIV   Normal postpartum exam. Labs pending.   Plan:   Essential components of care per ACOG recommendations:  1.  Mood and well being: Patient with negative depression screening today. Reviewed local resources for support.  - Patient tobacco use? No.   - hx of drug use? No.    2. Infant care and feeding:  -Patient currently breastmilk feeding? Yes. Discussed returning to work and pumping. Reviewed importance of draining breast regularly to support lactation.  -Social determinants of health (SDOH) reviewed in EPIC. No concerns.  3. Sexuality, contraception and birth spacing - Patient does not  want a pregnancy in the next year.  Desired family size is 3 children.  - Reviewed reproductive life planning. Reviewed contraceptive methods based on pt preferences and effectiveness.  Patient desired Withdrawal or Other Method today.   - Discussed birth spacing of 18 months, recommended at least barrier methods, spermicidal lubricants with / without ovulation sticks.    4. Sleep and fatigue -Encouraged family/partner/community support of 4 hrs of uninterrupted sleep to help with mood and fatigue  5. Physical Recovery  - Discussed patients delivery and complications. She describes her labor as good. - Patient had a Vaginal, no problems at delivery. Patient had no laceration. Perineal healing reviewed. Patient expressed understanding -  Patient has urinary incontinence? No. - Patient is safe to resume physical and sexual activity, but encouraged contraception as above   6.  Health Maintenance - Last pap smear  Diagnosis  Date Value Ref Range Status  12/14/2021   Final   - Negative for intraepithelial lesion or malignancy (NILM)   Pap smear not done at today's visit.  -Breast Cancer screening indicated? No.   7. Chronic Disease/Pregnancy Condition follow up: Gestational Diabetes - patient states has not continued the metformin as previously prescribed, GTT pending   - PCP follow up, referral sent to [facility] to establish PCP   Hessie Dibble, MD Center for [facility], [facility]

## 2023-06-20 LAB — CBC
Hematocrit: 41.7 % (ref 34.0–46.6)
Hemoglobin: 13.7 g/dL (ref 11.1–15.9)
MCH: 27.5 pg (ref 26.6–33.0)
MCHC: 32.9 g/dL (ref 31.5–35.7)
MCV: 84 fL (ref 79–97)
Platelets: 184 10*3/uL (ref 150–450)
RBC: 4.99 x10E6/uL (ref 3.77–5.28)
RDW: 14.3 % (ref 11.7–15.4)
WBC: 3.9 10*3/uL (ref 3.4–10.8)

## 2023-06-20 LAB — RPR: RPR Ser Ql: NONREACTIVE

## 2023-06-20 LAB — HIV ANTIBODY (ROUTINE TESTING W REFLEX): HIV Screen 4th Generation wRfx: NONREACTIVE

## 2023-06-20 LAB — GLUCOSE TOLERANCE, 2 HOURS W/ 1HR
Glucose, 1 hour: 183 mg/dL — ABNORMAL HIGH (ref 70–179)
Glucose, 2 hour: 98 mg/dL (ref 70–152)
Glucose, Fasting: 95 mg/dL — ABNORMAL HIGH (ref 70–91)

## 2024-04-08 ENCOUNTER — Ambulatory Visit

## 2024-04-09 ENCOUNTER — Ambulatory Visit

## 2024-04-21 ENCOUNTER — Ambulatory Visit

## 2024-04-21 VITALS — BP 107/74 | HR 77 | Wt 192.0 lb

## 2024-04-21 DIAGNOSIS — Z3201 Encounter for pregnancy test, result positive: Secondary | ICD-10-CM | POA: Diagnosis not present

## 2024-04-21 LAB — POCT URINE PREGNANCY: Preg Test, Ur: POSITIVE — AB

## 2024-04-21 NOTE — Progress Notes (Signed)
.  Ms. Bankson presents today for UPT. She has no unusual complaints.  LMP:01/26/2024    OBJECTIVE: Appears well, in no apparent distress.  OB History     Gravida  4   Para  2   Term  2   Preterm      AB  1   Living  2      SAB  1   IAB      Ectopic      Multiple  0   Live Births  2          Home UPT Result: POSITIVE In-Office UPT result: POSITIVE  I have reviewed the patient's medical, obstetrical, social, and family histories, and medications.   ASSESSMENT: Positive pregnancy test LMP  01/26/2024 EDD  02/14/20206 GA     [redacted]w[redacted]d  PLAN Prenatal care to be completed at: [facility]

## 2024-05-06 ENCOUNTER — Ambulatory Visit (INDEPENDENT_AMBULATORY_CARE_PROVIDER_SITE_OTHER): Admitting: *Deleted

## 2024-05-06 ENCOUNTER — Other Ambulatory Visit (INDEPENDENT_AMBULATORY_CARE_PROVIDER_SITE_OTHER): Payer: Self-pay

## 2024-05-06 VITALS — BP 112/85 | HR 84 | Wt 190.4 lb

## 2024-05-06 DIAGNOSIS — O0992 Supervision of high risk pregnancy, unspecified, second trimester: Secondary | ICD-10-CM | POA: Diagnosis not present

## 2024-05-06 DIAGNOSIS — O0990 Supervision of high risk pregnancy, unspecified, unspecified trimester: Secondary | ICD-10-CM

## 2024-05-06 DIAGNOSIS — O09899 Supervision of other high risk pregnancies, unspecified trimester: Secondary | ICD-10-CM | POA: Insufficient documentation

## 2024-05-06 DIAGNOSIS — O3680X0 Pregnancy with inconclusive fetal viability, not applicable or unspecified: Secondary | ICD-10-CM

## 2024-05-06 DIAGNOSIS — Z3482 Encounter for supervision of other normal pregnancy, second trimester: Secondary | ICD-10-CM | POA: Diagnosis not present

## 2024-05-06 DIAGNOSIS — Z3A14 14 weeks gestation of pregnancy: Secondary | ICD-10-CM

## 2024-05-06 MED ORDER — PRENATAL PLUS VITAMIN/MINERAL 27-1 MG PO TABS: 1.0000 | ORAL_TABLET | Freq: Every day | ORAL | 12 refills | Status: DC

## 2024-05-07 LAB — CBC/D/PLT+RPR+RH+ABO+RUBIGG...
Antibody Screen: NEGATIVE
Basophils Absolute: 0 x10E3/uL (ref 0.0–0.2)
Basos: 0 %
EOS (ABSOLUTE): 0 x10E3/uL (ref 0.0–0.4)
Eos: 1 %
HCV Ab: NONREACTIVE
HIV Screen 4th Generation wRfx: NONREACTIVE
Hematocrit: 41 % (ref 34.0–46.6)
Hemoglobin: 13.7 g/dL (ref 11.1–15.9)
Hepatitis B Surface Ag: NEGATIVE
Immature Grans (Abs): 0 x10E3/uL (ref 0.0–0.1)
Immature Granulocytes: 0 %
Lymphocytes Absolute: 1.3 x10E3/uL (ref 0.7–3.1)
Lymphs: 30 %
MCH: 28.8 pg (ref 26.6–33.0)
MCHC: 33.4 g/dL (ref 31.5–35.7)
MCV: 86 fL (ref 79–97)
Monocytes Absolute: 0.2 x10E3/uL (ref 0.1–0.9)
Monocytes: 5 %
Neutrophils Absolute: 2.8 x10E3/uL (ref 1.4–7.0)
Neutrophils: 64 %
Platelets: 177 x10E3/uL (ref 150–450)
RBC: 4.76 x10E6/uL (ref 3.77–5.28)
RDW: 12.9 % (ref 11.7–15.4)
RPR Ser Ql: NONREACTIVE
Rh Factor: POSITIVE
Rubella Antibodies, IGG: 3.28 {index} (ref >0.99)
WBC: 4.3 x10E3/uL (ref 3.4–10.8)

## 2024-05-07 LAB — HCV INTERPRETATION

## 2024-05-07 LAB — HEMOGLOBIN A1C
Est. average glucose Bld gHb Est-mCnc: 105 mg/dL
Hgb A1c MFr Bld: 5.3 % (ref 4.8–5.6)

## 2024-05-07 NOTE — Patient Instructions (Signed)
[facility] has a partnership with the [employer] to provide prenatal navigation for the most needed resources in our community. In order to see how we can help connect you to these resources we need consent to contact you. Please complete the very short consent using the link below:   English Link: [email or web address]  Spanish Link: [email or web address]  

## 2024-05-07 NOTE — Progress Notes (Signed)
 New OB Intake  Video Tigrinian interpreter used for entire visit.  I connected with Sarah Koch  on 05/07/24 at 10:15 AM EDT by In Person Visit and verified that I am speaking with the correct person using two identifiers. Nurse is located at [facility] and pt is located at [place].  I discussed the limitations, risks, security and privacy concerns of performing an evaluation and management service by telephone and the availability of in person appointments. I also discussed with the patient that there may be a patient responsible charge related to this service. The patient expressed understanding and agreed to proceed.  I explained I am completing New OB Intake today. We discussed EDD of TBD based on TBD. Pt is [id number]. I reviewed her allergies, medications and Medical/Surgical/OB history.    Patient Active Problem List   Diagnosis Date Noted   Supervision of high risk pregnancy, antepartum 05/06/2024   Vaginal delivery 03/29/2023   GDM, class A2 03/28/2023   History of gestational diabetes in prior pregnancy, currently pregnant 11/09/2022   Supervision of high-risk pregnancy 09/05/2022   Language barrier 08/25/2021   Gestational diabetes mellitus (GDM) in third trimester 08/15/2021   Obesity in pregnancy 05/24/2021     Concerns addressed today  Delivery Plans Plans to deliver at [facility]. Discussed the nature of our practice with multiple providers including residents and students as well as female and female providers. Due to the size of the practice, the delivering provider may not be the same as those providing prenatal care.    MyChart/Babyscripts MyChart access verified. I explained pt will have some visits in office and some virtually. Babyscripts instructions given and order placed. Patient verifies receipt of registration text/e-mail. Account successfully created and app downloaded. If patient is a candidate for Optimized scheduling, add to sticky note.   Blood Pressure  Cuff/Weight Scale Blood pressure cuff ordered for patient to pick-up from [employer]. Explained after first prenatal appt pt will check weekly and document in Babyscripts. Patient does not have weight scale; patient may purchase if they desire to track weight weekly in Babyscripts.  Anatomy US  Explained first scheduled US  will be around 19 weeks. Anatomy US  scheduled for TBD at TBD.  Is patient a candidate for Babyscripts Optimization? No, due to [employer]   First visit review I reviewed new OB appt with patient. Explained pt will be seen by Dr. Abigail at first visit. Discussed [place] genetic screening with patient. Requests Panorama. Routine prenatal labs collected at today's visit.   Last Pap Diagnosis  Date Value Ref Range Status  12/14/2021   Final   - Negative for intraepithelial lesion or malignancy (NILM)    Rocky [name], RN 05/07/2024  5:59 PM

## 2024-05-08 LAB — CULTURE, OB URINE

## 2024-05-08 LAB — URINE CULTURE, OB REFLEX

## 2024-05-12 ENCOUNTER — Ambulatory Visit: Payer: Self-pay | Admitting: Obstetrics and Gynecology

## 2024-05-12 LAB — PANORAMA PRENATAL TEST FULL PANEL:PANORAMA TEST PLUS 5 ADDITIONAL MICRODELETIONS: FETAL FRACTION: 10.2

## 2024-05-13 ENCOUNTER — Encounter: Admitting: Obstetrics and Gynecology

## 2024-05-13 DIAGNOSIS — Z3A15 15 weeks gestation of pregnancy: Secondary | ICD-10-CM

## 2024-05-13 DIAGNOSIS — O0990 Supervision of high risk pregnancy, unspecified, unspecified trimester: Secondary | ICD-10-CM

## 2024-05-14 ENCOUNTER — Other Ambulatory Visit

## 2024-06-13 ENCOUNTER — Other Ambulatory Visit

## 2024-06-13 ENCOUNTER — Other Ambulatory Visit (HOSPITAL_COMMUNITY)
Admission: RE | Admit: 2024-06-13 | Discharge: 2024-06-13 | Disposition: A | Discharge status: Home / Self Care | Source: Ambulatory Visit | Attending: Obstetrics and Gynecology | Admitting: Obstetrics and Gynecology

## 2024-06-13 ENCOUNTER — Ambulatory Visit (INDEPENDENT_AMBULATORY_CARE_PROVIDER_SITE_OTHER): Payer: Self-pay | Admitting: Obstetrics and Gynecology

## 2024-06-13 VITALS — BP 105/71 | HR 105 | Wt 196.0 lb

## 2024-06-13 DIAGNOSIS — O0990 Supervision of high risk pregnancy, unspecified, unspecified trimester: Secondary | ICD-10-CM | POA: Diagnosis present

## 2024-06-13 DIAGNOSIS — O0992 Supervision of high risk pregnancy, unspecified, second trimester: Secondary | ICD-10-CM | POA: Diagnosis not present

## 2024-06-13 DIAGNOSIS — O0932 Supervision of pregnancy with insufficient antenatal care, second trimester: Secondary | ICD-10-CM

## 2024-06-13 DIAGNOSIS — Z3A19 19 weeks gestation of pregnancy: Secondary | ICD-10-CM

## 2024-06-13 DIAGNOSIS — Z8759 Personal history of other complications of pregnancy, childbirth and the puerperium: Secondary | ICD-10-CM

## 2024-06-13 DIAGNOSIS — O09292 Supervision of pregnancy with other poor reproductive or obstetric history, second trimester: Secondary | ICD-10-CM | POA: Diagnosis not present

## 2024-06-13 DIAGNOSIS — Z603 Acculturation difficulty: Secondary | ICD-10-CM

## 2024-06-13 DIAGNOSIS — O09299 Supervision of pregnancy with other poor reproductive or obstetric history, unspecified trimester: Secondary | ICD-10-CM

## 2024-06-13 DIAGNOSIS — Z8632 Personal history of gestational diabetes: Secondary | ICD-10-CM

## 2024-06-13 DIAGNOSIS — Z758 Other problems related to medical facilities and other health care: Secondary | ICD-10-CM

## 2024-06-13 DIAGNOSIS — O0930 Supervision of pregnancy with insufficient antenatal care, unspecified trimester: Secondary | ICD-10-CM | POA: Insufficient documentation

## 2024-06-13 NOTE — Progress Notes (Signed)
 Pt complains of painful area in abdomen. Pt does have occ constipation.    Pt has anatomy ordered, needs to be scheduled.

## 2024-06-13 NOTE — Progress Notes (Signed)
 INITIAL PRENATAL VISIT NOTE  Subjective:  Sarah Koch is a 33 y.o. [id number] at [redacted]w[redacted]d by LMP being seen today for her initial prenatal visit. She has an obstetric history significant for gestational diabetes and a third degree perineal laceration.  Pt did not show up for postpartum GTT, so she is getting an early 2 hour GTT today. She has a medical history significant for lower extremity varicosities.  Patient reports mild abdominal pain.  Contractions: Not present. Vag. Bleeding: None.  Movement: Present. Denies leaking of fluid.    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  Drysol was ineffective.  Await lab results.  If not pregnant, would consider a trial of oxybutynin 5 mg 3 times daily.   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    Past Surgical History:  Procedure Laterality Date   NO PAST SURGERIES      OB History  Gravida Para Term Preterm AB Living  4 2 2  1 2   SAB IAB Ectopic Multiple Live Births  1 0 0 0 2    # Outcome Date GA Lbr Len/2nd Weight Sex Type Anes PTL Lv  4 Current           3 Term 03/29/23 [redacted]w[redacted]d / 00:13 7 lb 4.8 oz (3.31 kg) M Vag-Spont EPI  LIV  2 Term 11/02/21 [redacted]w[redacted]d / 02:55 8 lb 1.1 oz (3.66 kg) F Vag-Spont EPI  LIV  1 SAB             Social History   Socioeconomic History   Marital status: Single    Spouse name: Not on file   Number of children: Not on file   Years of education: Not on file   Highest education level: Not on file  Occupational History   Not on file  Tobacco Use   Smoking status: Never   Smokeless tobacco: Never  Vaping Use    Vaping status: Never Used  Substance and Sexual Activity   Alcohol use: No    Alcohol/week: 0.0 standard drinks of alcohol   Drug use: No   Sexual activity: Yes    Partners: Male    Birth control/protection: None  Other Topics Concern   Not on file  Social History Narrative   Not on file   Social Drivers of Health   Financial Resource Strain: Not on file  Food Insecurity: No Food Insecurity (03/28/2023)   Hunger Vital Sign    Worried About Running Out of Food in the Last Year: Never true    Ran Out of Food in the Last Year: Never true  Transportation Needs: No Transportation Needs (03/28/2023)   PRAPARE - Administrator, Civil Service (Medical): No    Lack of Transportation (Non-Medical): No  Physical Activity: Not on file  Stress: Not on file  Social Connections: Unknown (01/30/2022)   Received from [facility]   Social Network    Social Network: Not on file    Family History  Problem Relation Age of Onset   Asthma Neg Hx    Cancer Neg Hx  Diabetes Neg Hx    Heart disease Neg Hx    Hypertension Neg Hx      Current Outpatient Medications:    Prenatal Vit-Fe Fumarate-FA (PRENATAL PLUS VITAMIN/MINERAL) 27-1 MG TABS, Take 1 tablet by mouth daily., Disp: 30 tablet, Rfl: 12   acetaminophen  (TYLENOL ) 325 MG tablet, Take 2 tablets (650 mg total) by mouth every 4 (four) hours as needed (for pain scale < 4)., Disp: 30 tablet, Rfl: 0   Prenatal Vit-Fe Fumarate-FA (PREPLUS) 27-1 MG TABS, Take 1 tablet by mouth daily., Disp: 30 tablet, Rfl: 13  Allergies  Allergen Reactions   Pork-Derived Products     Review of Systems: Negative except for what is mentioned in HPI.  Objective:   Vitals:   06/13/24 0840  BP: 105/71  Pulse: (!) 105  Weight: 196 lb (88.9 kg)    Fetal Status: Fetal Heart Rate (bpm): 145 Fundal Height: 19 cm Movement: Present     Physical Exam: BP 105/71   Pulse (!) 105   Wt 196 lb (88.9 kg)   LMP 01/26/2024 (Exact Date)   BMI 35.85  kg/m  CONSTITUTIONAL: Well-developed, well-nourished female in no acute distress.  NEUROLOGIC: Alert and oriented to person, place, and time. Normal reflexes, muscle tone coordination. No cranial nerve deficit noted. PSYCHIATRIC: Normal mood and affect. Normal behavior. Normal judgment and thought content. SKIN: Skin is warm and dry. No rash noted. Not diaphoretic. No erythema. No pallor. HENT:  Normocephalic, atraumatic, External right and left ear normal. Oropharynx is clear and moist EYES: Conjunctivae and EOM are normal. Pupils are equal, round, and reactive to light. No scleral icterus.  NECK: Normal range of motion, supple, no masses CARDIOVASCULAR: Normal heart rate noted, regular rhythm RESPIRATORY: Effort and breath sounds normal, no problems with respiration noted BREASTS:deferred ABDOMEN: Soft, nontender, nondistended, gravid. HL:[id number] MUSCULOSKELETAL: Normal range of motion. EXT:  No edema and no tenderness. 2+ distal pulses.   Assessment and Plan:  Pregnancy: [id number] at [redacted]w[redacted]d by LMP  1. Supervision of high risk pregnancy, antepartum (Primary) Continue routine prenatal care  - Glucose Tolerance, 2 Hours w/1 Hour - Cervicovaginal ancillary only( [place])  2. [redacted] weeks gestation of pregnancy  - Glucose Tolerance, 2 Hours w/1 Hour  3. Language barrier Tele interpreter utilized  4. History of gestational diabetes in prior pregnancy, currently pregnant Early 2 hour GTT today  5. History of third degree perineal laceration Care at time of delivery  6. Late prenatal care affecting pregnancy in second trimester Dating /anatomy scan to be scheduled   Preterm labor symptoms and general obstetric precautions including but not limited to vaginal bleeding, contractions, leaking of fluid and fetal movement were reviewed in detail with the patient.  Please refer to After Visit Summary for other counseling recommendations.   Return in about 4 weeks (around  07/11/2024) for [facility], in person.  Jerilynn DELENA Buddle 06/13/2024 9:04 AM

## 2024-06-14 LAB — GLUCOSE TOLERANCE, 2 HOURS W/ 1HR
Glucose, 1 hour: 131 mg/dL (ref 70–179)
Glucose, 2 hour: 96 mg/dL (ref 70–152)
Glucose, Fasting: 95 mg/dL — ABNORMAL HIGH (ref 70–91)

## 2024-06-15 ENCOUNTER — Ambulatory Visit: Payer: Self-pay | Admitting: Obstetrics and Gynecology

## 2024-06-15 DIAGNOSIS — O0990 Supervision of high risk pregnancy, unspecified, unspecified trimester: Secondary | ICD-10-CM

## 2024-06-16 LAB — CERVICOVAGINAL ANCILLARY ONLY
Chlamydia: NEGATIVE
Comment: NEGATIVE
Comment: NEGATIVE
Comment: NORMAL
Neisseria Gonorrhea: NEGATIVE
Trichomonas: NEGATIVE

## 2024-06-16 MED ORDER — ACCU-CHEK GUIDE W/DEVICE KIT
1.0000 | PACK | Freq: Four times a day (QID) | 0 refills | Status: AC
Start: 2024-06-16 — End: ?

## 2024-06-16 MED ORDER — ACCU-CHEK SOFTCLIX LANCETS MISC
12 refills | Status: AC
Start: 2024-06-16 — End: ?

## 2024-06-16 MED ORDER — GLUCOSE BLOOD VI STRP
ORAL_STRIP | 12 refills | Status: AC
Start: 2024-06-16 — End: ?

## 2024-06-20 NOTE — Progress Notes (Signed)
 Patient was seen for Gestational Diabetes on 06/26/2024  Start time 1100 and End time 1208   Estimated due date: 11/01/2024; [redacted]w[redacted]d  Tigrinian Interpreter from [employer]; [phone]  Clinical: Medications:  Current Outpatient Medications:    Accu-Chek Softclix Lancets lancets, Check blood sugars four times daily; when waking up and 2 hours after breakfast, lunch, and dinner., Disp: 100 each, Rfl: 12   acetaminophen  (TYLENOL ) 325 MG tablet, Take 2 tablets (650 mg total) by mouth every 4 (four) hours as needed (for pain scale < 4)., Disp: 30 tablet, Rfl: 0   Blood Glucose Monitoring Suppl (ACCU-CHEK GUIDE) w/Device KIT, 1 Device by Does not apply route 4 (four) times daily., Disp: 1 kit, Rfl: 0   glucose blood test strip, Check blood sugars four times daily; when waking up and 2 hours after breakfast, lunch, and dinner., Disp: 100 each, Rfl: 12   Prenatal Vit-Fe Fumarate-FA (PRENATAL PLUS VITAMIN/MINERAL) 27-1 MG TABS, Take 1 tablet by mouth daily., Disp: 30 tablet, Rfl: 12   Prenatal Vit-Fe Fumarate-FA (PREPLUS) 27-1 MG TABS, Take 1 tablet by mouth daily., Disp: 30 tablet, Rfl: 13  Medical History:  Past Medical History:  Diagnosis Date   GBS (group B Streptococcus carrier), +RV culture, currently pregnant 10/13/2021   Gestational diabetes mellitus (GDM) in third trimester 08/15/2021   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  Drysol was ineffective.  Await lab results.  If not pregnant, would consider a trial of oxybutynin 5 mg 3 times daily.   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 Pt reports  previous GDM treated with oral medication. Pt reports she started testing her blood sugar yesterday. Pt reports 2-3 hours after evening meal she obtained a value of 99 mg/dL. Pt reports this morning before breakfast blood sugar was 101 mg/dL. Pt reports she does the cooking and shopping for a family of four. Pt avoids pork. Pt reports typical intake of 3 meals and 2 snacks daily.  Pt reports she has reduced juice intake to twice weekly.  All Pt's questions were answered during this encounter.    Physical Activity: walks for 10-15 minutes 2 days weekly Stress: 3-4 out of 10/ self care includes: rest Sleep: normal  24 hr Recall:  First Meal:  eggs sandwich made with 2 slices of wheat bread or injera made with teff flour with spinach/salad or porridge made with yogurt, coffee or tea with added sugar and milk Snack:  handful of grapes or 1 orange or 1 apple or 1 banana with almonds with peanuts Second meal:  tomato, onion, beef or lamb, potatoes, cabbage, spinach, water Snack:  none Third meal: lentils, injera, salad, water  (99 mg/dL, reported as 2-3 hour post prandial per Pt) Snack:  milk, bread Beverages:  water, milk, home made juice  NUTRITION INTERVENTION  Nutrition education (E-1) on the following topics:   Initial Follow-up  [x]  []  Definition of Gestational Diabetes [x]  []  Why dietary management is important in controlling blood glucose [x]  []  Effects each nutrient has on blood glucose levels [x]  []  Simple carbohydrates vs complex  carbohydrates [x]  []  Fluid intake [x]  []  Creating a balanced meal plan []  []  Carbohydrate counting  [x]  []  When to check blood glucose levels [x]  []  Proper blood glucose monitoring techniques [x]  []  Effect of stress and stress reduction techniques  [x]  []  Exercise effect on blood glucose levels, appropriate exercise during pregnancy [x]  []  Importance of limiting caffeine and abstaining from alcohol and smoking [x]  []  Medications used for blood  sugar control during pregnancy [x]  []  Hypoglycemia and rule of 15 [x]  []  Postpartum self care  Patient has a meter prior to visit. Patient is instructed testing pre breakfast and 2 hours after each meal. CBS: 81 mg/dL  Patient instructed to monitor glucose levels: QID FBS: 60 - <= 95 mg/dL; 2 hour: <= 879 mg/dL  Patient received handouts: Blood glucose log [employer]rinian  [email or web address] Meal Plan Basics- [facility] Patient and Family Education-[facility]   Patient will be seen for follow-up:

## 2024-06-26 ENCOUNTER — Encounter: Attending: Obstetrics | Admitting: Dietician

## 2024-06-26 ENCOUNTER — Ambulatory Visit: Admitting: Dietician

## 2024-06-26 ENCOUNTER — Other Ambulatory Visit: Payer: Self-pay

## 2024-06-26 DIAGNOSIS — O0990 Supervision of high risk pregnancy, unspecified, unspecified trimester: Secondary | ICD-10-CM | POA: Diagnosis present

## 2024-06-26 DIAGNOSIS — Z3A00 Weeks of gestation of pregnancy not specified: Secondary | ICD-10-CM | POA: Diagnosis not present

## 2024-06-26 DIAGNOSIS — Z3A21 21 weeks gestation of pregnancy: Secondary | ICD-10-CM

## 2024-06-26 DIAGNOSIS — Z713 Dietary counseling and surveillance: Secondary | ICD-10-CM | POA: Diagnosis not present

## 2024-06-26 DIAGNOSIS — O0992 Supervision of high risk pregnancy, unspecified, second trimester: Secondary | ICD-10-CM

## 2024-07-11 ENCOUNTER — Ambulatory Visit (INDEPENDENT_AMBULATORY_CARE_PROVIDER_SITE_OTHER): Admitting: Obstetrics and Gynecology

## 2024-07-11 ENCOUNTER — Encounter: Payer: Self-pay | Admitting: Obstetrics and Gynecology

## 2024-07-11 VITALS — BP 93/67 | HR 101 | Wt 199.0 lb

## 2024-07-11 DIAGNOSIS — O0990 Supervision of high risk pregnancy, unspecified, unspecified trimester: Secondary | ICD-10-CM

## 2024-07-11 DIAGNOSIS — O99210 Obesity complicating pregnancy, unspecified trimester: Secondary | ICD-10-CM

## 2024-07-11 DIAGNOSIS — O24119 Pre-existing diabetes mellitus, type 2, in pregnancy, unspecified trimester: Secondary | ICD-10-CM | POA: Diagnosis not present

## 2024-07-11 NOTE — Progress Notes (Addendum)
   PRENATAL VISIT NOTE  Subjective:  Sarah Koch is a 33 y.o. H5E7987 at [redacted]w[redacted]d being seen today for ongoing prenatal care.  She is currently monitored for the following issues for this high-risk pregnancy and has Obesity in pregnancy; Gestational diabetes mellitus (GDM) in third trimester; Language barrier; Supervision of high-risk pregnancy; History of gestational diabetes in prior pregnancy, currently pregnant; GDM, class A2; Vaginal delivery; Supervision of high risk pregnancy, antepartum; History of third degree perineal laceration; Late prenatal care affecting pregnancy; and Pre-existing type 2 diabetes affecting pregnancy, antepartum on their problem list.  Patient reports generalized pruritis.  Contractions: Not present. Vag. Bleeding: None.  Movement: Present. Denies leaking of fluid.   The following portions of the patient's history were reviewed and updated as appropriate: allergies, current medications, past family history, past medical history, past social history, past surgical history and problem list.   Objective:    Vitals:   07/11/24 1018  BP: 93/67  Pulse: (!) 101  Weight: 199 lb (90.3 kg)    Fetal Status:  Fetal Heart Rate (bpm): 157 Fundal Height: 25 cm Movement: Present    General: Alert, oriented and cooperative. Patient is in no acute distress.  Skin: Skin is warm and dry. No rash noted.   Cardiovascular: Normal heart rate noted  Respiratory: Normal respiratory effort, no problems with respiration noted  Abdomen: Soft, gravid, appropriate for gestational age.  Pain/Pressure: Absent     Pelvic: Cervical exam deferred        Extremities: Normal range of motion.  Edema: Trace  Mental Status: Normal mood and affect. Normal behavior. Normal judgment and thought content.   Assessment and Plan:  Pregnancy: [id number] at [redacted]w[redacted]d 1. Supervision of high risk pregnancy, antepartum (Primary) Patient is doing well without complaints Bile acids ordered Patient advised to use  hydrocortisone or benadryl  cream for pruritus Follow up anatomy ultrasound  2. Obesity in pregnancy Discussed excessive weight gain thus far  3. Pre-existing type 2 diabetes affecting pregnancy, antepartum Patient did not bring CBG log and reports majority of values within range Continue diet control Patient is not taking ASA Baseline labs ordered Fetal echo ordered  Tigrinian interpreter used during the encounter  Preterm labor symptoms and general obstetric precautions including but not limited to vaginal bleeding, contractions, leaking of fluid and fetal movement were reviewed in detail with the patient. Please refer to After Visit Summary for other counseling recommendations.   Return in about 4 weeks (around 08/08/2024) for in person, ROB, High risk.  Future Appointments  Date Time Provider Department Center  08/07/2024 11:15 AM [facility]  08/28/2024 10:00 AM [facility] PROVIDER 1 [facility]  08/28/2024 10:30 AM [facility] US3 [facility]    Winton Felt, MD

## 2024-07-11 NOTE — Progress Notes (Signed)
 ROB, Pt forgot her BS log. Fasting 91, Postprandial 100-105.  C/o itching all over her body.  MFM cancelled US , we will reschedule.

## 2024-07-12 LAB — PROTEIN / CREATININE RATIO, URINE
Creatinine, Urine: 57.6 mg/dL
Protein, Ur: 10.8 mg/dL
Protein/Creat Ratio: 188 mg/g{creat} (ref 0–200)

## 2024-07-13 LAB — COMPREHENSIVE METABOLIC PANEL WITH GFR
ALT: 9 IU/L (ref 0–32)
AST: 11 IU/L (ref 0–40)
Albumin: 3.4 g/dL — ABNORMAL LOW (ref 4.0–5.0)
Alkaline Phosphatase: 60 IU/L (ref 41–116)
BUN/Creatinine Ratio: 12 (ref 9–23)
BUN: 5 mg/dL — ABNORMAL LOW (ref 6–20)
Bilirubin Total: 0.2 mg/dL (ref 0.0–1.2)
CO2: 22 mmol/L (ref 20–29)
Calcium: 9.2 mg/dL (ref 8.7–10.2)
Chloride: 102 mmol/L (ref 96–106)
Creatinine, Ser: 0.43 mg/dL — ABNORMAL LOW (ref 0.57–1.00)
Globulin, Total: 2.8 g/dL (ref 1.5–4.5)
Glucose: 82 mg/dL (ref 70–99)
Potassium: 4.2 mmol/L (ref 3.5–5.2)
Sodium: 137 mmol/L (ref 134–144)
Total Protein: 6.2 g/dL (ref 6.0–8.5)
eGFR: 134 mL/min/1.73 (ref >59)

## 2024-07-13 LAB — CBC
Hematocrit: 36.7 % (ref 34.0–46.6)
Hemoglobin: 12 g/dL (ref 11.1–15.9)
MCH: 28.4 pg (ref 26.6–33.0)
MCHC: 32.7 g/dL (ref 31.5–35.7)
MCV: 87 fL (ref 79–97)
Platelets: 152 x10E3/uL (ref 150–450)
RBC: 4.22 x10E6/uL (ref 3.77–5.28)
RDW: 13.6 % (ref 11.7–15.4)
WBC: 7 x10E3/uL (ref 3.4–10.8)

## 2024-07-13 LAB — BILE ACIDS, TOTAL: Bile Acids Total: 1.5 umol/L (ref 0.0–10.0)

## 2024-07-24 NOTE — Progress Notes (Unsigned)
 Patient was seen for Gestational Diabetes on 08/07/2024 Start time 1120 and End time 1200  Estimated due date: 11/01/2024; [redacted]w[redacted]d  Tigrinian Interpreter from [employer] interpre[employer]; [phone]  Clinical: Medications:  Current Outpatient Medications:    Accu-Chek Softclix Lancets lancets, Check blood sugars four times daily; when waking up and 2 hours after breakfast, lunch, and dinner., Disp: 100 each, Rfl: 12   acetaminophen  (TYLENOL ) 325 MG tablet, Take 2 tablets (650 mg total) by mouth every 4 (four) hours as needed (for pain scale < 4)., Disp: 30 tablet, Rfl: 0   Blood Glucose Monitoring Suppl (ACCU-CHEK GUIDE) w/Device KIT, 1 Device by Does not apply route 4 (four) times daily., Disp: 1 kit, Rfl: 0   glucose blood test strip, Check blood sugars four times daily; when waking up and 2 hours after breakfast, lunch, and dinner., Disp: 100 each, Rfl: 12   Prenatal Vit-Fe Fumarate-FA (PRENATAL PLUS VITAMIN/MINERAL) 27-1 MG TABS, Take 1 tablet by mouth daily., Disp: 30 tablet, Rfl: 12   Prenatal Vit-Fe Fumarate-FA (PREPLUS) 27-1 MG TABS, Take 1 tablet by mouth daily., Disp: 30 tablet, Rfl: 13  Medical History:  Past Medical History:  Diagnosis Date   GBS (group B Streptococcus carrier), +RV culture, currently pregnant 10/13/2021   Gestational diabetes mellitus (GDM) in third trimester 08/15/2021   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  Drysol was ineffective.  Await lab results.  If not pregnant, would consider a trial of oxybutynin 5 mg 3 times daily.   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 Pt reports she is test her blood sugar four times daily. Pt reports her appetite has decreased. Pt reports she has reduced juice intake to once weekly. Pt adds sugar to her beverages and this practice was strongly discouraged.  Pt reports she is close to being out of blood sugar strips; RD contacted pharmacy and made Pt aware refill is ready for pick up.    Hx: Pt reports previous GDM treated with oral medication. Pt reports she does the cooking and shopping for a family of four. Pt avoids pork. Pt reports typical intake of 3 meals and 2 snacks daily.   All Pt's questions were answered during this encounter.    Physical Activity: walks for 10-15 minutes 2 days weekly Stress: 3-4 out of 10/ self care includes: rest Sleep: normal  24 hr Recall:  First Meal: porridge with yogurt, coffee with added sugar and milk (100 mg/dL, reported as 2 hour post prandial)  Snack: apple or roasted barley or orange Second meal: injera, onions, tomatoes, bell peppers, beef stew, water (111 mg/dL, reported as 2 hour post prandial)  Snack:  nuts, apple Third meal: rice, chicken water, carrots, green beans, bell peppers, black tea with brown sugar (107 mg/dL, reported as 2 hour post prandial per Pt) Snack: tea with added sugar and milk or milk  Beverages:  water, milk, home made juice once weekly, black tea with brown sugar   NUTRITION INTERVENTION  Nutrition education (E-1) on the following topics:   Initial Follow-up  [  x] []  Definition of Gestational Diabetes [x]  [x]  Why dietary management is important in controlling blood glucose [x]  [x]  Effects each nutrient has on blood glucose levels [x]  []  Simple carbohydrates vs complex carbohydrates [x]  [x]  Fluid intake [x]  [x]  Creating a balanced meal plan []  []  Carbohydrate counting  [x]  [x]  When to check blood glucose levels [x]  []  Proper blood glucose monitoring techniques [x]  []  Effect of stress and stress reduction techniques   [x]  [x]  Exercise effect on blood glucose levels, appropriate exercise during pregnancy [x]  []  Importance of limiting caffeine and abstaining from alcohol and smoking [x]  [x]  Medications used for blood sugar control during pregnancy [x]  []  Hypoglycemia and rule of 15 [x]  []  Postpartum self care  Patient has a meter prior to visit. Patient is instructed to continue testing pre breakfast and 2 hours after each meal.   Patient instructed to monitor glucose levels: QID FBS: 60 - <= 95 mg/dL; 2 hour: <= 879 mg/dL  Patient handouts reviewed: Blood glucose log-provided today  [email or web address] Meal Plan Basics- [facility] Patient and Family Education-[employer] -[facility]   Patient will be seen for follow-up : 09/23/2024

## 2024-07-28 ENCOUNTER — Ambulatory Visit

## 2024-07-28 ENCOUNTER — Other Ambulatory Visit

## 2024-08-07 ENCOUNTER — Other Ambulatory Visit: Payer: Self-pay

## 2024-08-07 ENCOUNTER — Ambulatory Visit (INDEPENDENT_AMBULATORY_CARE_PROVIDER_SITE_OTHER): Payer: Self-pay | Admitting: Dietician

## 2024-08-07 ENCOUNTER — Encounter: Attending: Obstetrics and Gynecology | Admitting: Dietician

## 2024-08-07 DIAGNOSIS — Z3A27 27 weeks gestation of pregnancy: Secondary | ICD-10-CM

## 2024-08-07 DIAGNOSIS — Z3A00 Weeks of gestation of pregnancy not specified: Secondary | ICD-10-CM | POA: Insufficient documentation

## 2024-08-07 DIAGNOSIS — O24119 Pre-existing diabetes mellitus, type 2, in pregnancy, unspecified trimester: Secondary | ICD-10-CM

## 2024-08-07 DIAGNOSIS — O0990 Supervision of high risk pregnancy, unspecified, unspecified trimester: Secondary | ICD-10-CM | POA: Insufficient documentation

## 2024-08-07 DIAGNOSIS — Z713 Dietary counseling and surveillance: Secondary | ICD-10-CM | POA: Diagnosis not present

## 2024-08-07 DIAGNOSIS — O0992 Supervision of high risk pregnancy, unspecified, second trimester: Secondary | ICD-10-CM

## 2024-08-07 DIAGNOSIS — O24112 Pre-existing diabetes mellitus, type 2, in pregnancy, second trimester: Secondary | ICD-10-CM

## 2024-08-08 ENCOUNTER — Encounter: Admitting: Obstetrics and Gynecology

## 2024-08-13 ENCOUNTER — Ambulatory Visit (INDEPENDENT_AMBULATORY_CARE_PROVIDER_SITE_OTHER): Admitting: Family Medicine

## 2024-08-13 ENCOUNTER — Encounter: Payer: Self-pay | Admitting: Family Medicine

## 2024-08-13 VITALS — BP 108/66 | HR 105 | Wt 200.6 lb

## 2024-08-13 DIAGNOSIS — Z23 Encounter for immunization: Secondary | ICD-10-CM | POA: Diagnosis not present

## 2024-08-13 DIAGNOSIS — O99213 Obesity complicating pregnancy, third trimester: Secondary | ICD-10-CM

## 2024-08-13 DIAGNOSIS — O24113 Pre-existing diabetes mellitus, type 2, in pregnancy, third trimester: Secondary | ICD-10-CM

## 2024-08-13 DIAGNOSIS — O24119 Pre-existing diabetes mellitus, type 2, in pregnancy, unspecified trimester: Secondary | ICD-10-CM

## 2024-08-13 DIAGNOSIS — O0990 Supervision of high risk pregnancy, unspecified, unspecified trimester: Secondary | ICD-10-CM

## 2024-08-13 DIAGNOSIS — Z758 Other problems related to medical facilities and other health care: Secondary | ICD-10-CM

## 2024-08-13 DIAGNOSIS — O0993 Supervision of high risk pregnancy, unspecified, third trimester: Secondary | ICD-10-CM | POA: Diagnosis not present

## 2024-08-13 DIAGNOSIS — O99210 Obesity complicating pregnancy, unspecified trimester: Secondary | ICD-10-CM

## 2024-08-13 DIAGNOSIS — Z3A28 28 weeks gestation of pregnancy: Secondary | ICD-10-CM

## 2024-08-13 DIAGNOSIS — L282 Other prurigo: Secondary | ICD-10-CM

## 2024-08-13 DIAGNOSIS — Z603 Acculturation difficulty: Secondary | ICD-10-CM

## 2024-08-13 MED ORDER — CETIRIZINE HCL 5 MG PO TABS: 5.0000 mg | ORAL_TABLET | Freq: Every day | ORAL | 3 refills | Status: AC

## 2024-08-13 MED ORDER — HYDROCORTISONE 2.5 % EX CREA
TOPICAL_CREAM | Freq: Two times a day (BID) | CUTANEOUS | 2 refills | Status: AC
Start: 2024-08-13 — End: ?

## 2024-08-13 NOTE — Progress Notes (Signed)
 PRENATAL VISIT NOTE  Subjective:  Sarah Koch is a 33 y.o. H5E7987 at [redacted]w[redacted]d being seen today for ongoing prenatal care.  She is currently monitored for the following issues for this high-risk pregnancy and has Obesity in pregnancy; Language barrier; History of gestational diabetes in prior pregnancy, currently pregnant; Supervision of high risk pregnancy, antepartum; History of third degree perineal laceration; Late prenatal care affecting pregnancy; and Pre-existing type 2 diabetes affecting pregnancy, antepartum on their problem list.  Patient reports ongoing generalized pruritus and rash on her left thigh and back and right arm.  She reports she has been using cream without improvement. Bile acids within normal limits at last visit. Contractions: Not present. Vag. Bleeding: None.  Movement: Present. Denies leaking of fluid.   The following portions of the patient's history were reviewed and updated as appropriate: allergies, current medications, past family history, past medical history, past social history, past surgical history and problem list.   Objective:   Vitals:   08/13/24 0858  BP: 108/66  Pulse: (!) 105  Weight: 200 lb 9.6 oz (91 kg)    Fetal Status:  Fetal Heart Rate (bpm): 156   Movement: Present    General: Alert, oriented and cooperative. Patient is in no acute distress.  Skin: Skin is warm and dry. No rash noted.   Cardiovascular: Normal heart rate noted  Respiratory: Normal respiratory effort, no problems with respiration noted  Abdomen: Soft, gravid, appropriate for gestational age.  Pain/Pressure: Absent     Pelvic: Cervical exam deferred        Extremities: Normal range of motion.  Edema: None  Mental Status: Normal mood and affect. Normal behavior. Normal judgment and thought content.      08/13/2024    9:01 AM 05/06/2024   11:22 AM 02/05/2023   11:24 AM  Depression screen PHQ 2/9  Decreased Interest 0 0 0  Down, Depressed, Hopeless 0 0 0  PHQ - 2 Score  0 0 0  Altered sleeping 0 0   Tired, decreased energy 0 0   Change in appetite 0 0   Feeling bad or failure about yourself  0 0   Trouble concentrating 0 0   Moving slowly or fidgety/restless 0 0   Suicidal thoughts 0 0   PHQ-9 Score 0 0       Data saved with a previous flowsheet row definition        08/13/2024    9:01 AM 05/06/2024   11:23 AM 09/05/2022    1:38 PM 10/17/2021    3:24 PM  GAD 7 : Generalized Anxiety Score  Nervous, Anxious, on Edge 0 0 0 0  Control/stop worrying 0 0 0 0  Worry too much - different things 0 0 0 0  Trouble relaxing 0 0 0 0  Restless 0 0 0 0  Easily annoyed or irritable 0 0 0 0  Afraid - awful might happen 0 0 0 0  Total GAD 7 Score 0 0 0 0  Anxiety Difficulty    Not difficult at all    Assessment and Plan:  Pregnancy: [id number] at [redacted]w[redacted]d 1. Supervision of high risk pregnancy, antepartum (Primary) Prenatal course reviewed BP, HR, FHR within normal limits Feeling regular FM  Contraception: Nexplanon in hospital Pediatrician: [facility]  2. Language barrier Due to language barrier, a virtual interpreter was present during the history-taking, physical exam, and subsequent discussion with this patient.    3. Obesity in pregnancy Excessive weight gain this  pregnancy Met with nutritionist 11/20 Not taking aspirin   4. Pre-existing type 2 diabetes affecting pregnancy, antepartum Current regimen: nutrition and lifestyle CBG review: <50% fasting glucose above goal, but recommend stopping food intake around 8pm to maintain good glucose control going into third trimester. Majority of postprandial values at goal. Regimen changes: none Growth US : scheduled 08/28/2024 Antenatal monitoring: Begin antenatal testing at 32 weeks Other needs: Fetal echo ordered at last visit, completed yesterday. Within normal limits.  5. [redacted] weeks gestation of pregnancy 28 week labs today Discussed recommendation for Tdap in every pregnancy.  6. Pruritic  rash Bile acids within normal limits at last visit Erythematous rash with small papules on stomach. Recommend daily cetirizine  and hydrocortisone  cream BID if needed. - cetirizine  (ZYRTEC ) 5 MG tablet; Take 1 tablet (5 mg total) by mouth daily.  Dispense: 30 tablet; Refill: 3 - hydrocortisone  2.5 % cream; Apply topically 2 (two) times daily.  Dispense: 30 g; Refill: 2  Preterm labor symptoms and general obstetric precautions including but not limited to vaginal bleeding, contractions, leaking of fluid and fetal movement were reviewed in detail with the patient. Please refer to After Visit Summary for other counseling recommendations.   Return in about 2 weeks (around 08/27/2024) for [facility] with MD.  Future Appointments  Date Time Provider Department Center  08/28/2024 10:00 AM [facility] PROVIDER 1 [facility]  08/28/2024 10:30 AM [facility] US3 [facility]  09/23/2024  8:15 AM [facility]    Sarah DELENA Sear, PA

## 2024-08-13 NOTE — Progress Notes (Signed)
 Pt presents for ROB visit. C/o itchy rash all over.

## 2024-08-13 NOTE — Addendum Note (Signed)
 Addended by: Laronda Lisby V on: 08/13/2024 11:34 AM   Modules accepted: Orders

## 2024-08-28 ENCOUNTER — Ambulatory Visit: Admitting: Obstetrics and Gynecology

## 2024-08-28 ENCOUNTER — Telehealth: Payer: Self-pay

## 2024-08-28 ENCOUNTER — Other Ambulatory Visit: Payer: Self-pay | Admitting: Obstetrics and Gynecology

## 2024-08-28 ENCOUNTER — Ambulatory Visit: Attending: Obstetrics and Gynecology

## 2024-08-28 VITALS — BP 115/70 | HR 115

## 2024-08-28 DIAGNOSIS — O24410 Gestational diabetes mellitus in pregnancy, diet controlled: Secondary | ICD-10-CM | POA: Diagnosis not present

## 2024-08-28 DIAGNOSIS — O09899 Supervision of other high risk pregnancies, unspecified trimester: Secondary | ICD-10-CM | POA: Diagnosis present

## 2024-08-28 DIAGNOSIS — Z3A30 30 weeks gestation of pregnancy: Secondary | ICD-10-CM | POA: Diagnosis present

## 2024-08-28 DIAGNOSIS — O0990 Supervision of high risk pregnancy, unspecified, unspecified trimester: Secondary | ICD-10-CM | POA: Insufficient documentation

## 2024-08-28 DIAGNOSIS — O99210 Obesity complicating pregnancy, unspecified trimester: Secondary | ICD-10-CM | POA: Diagnosis present

## 2024-08-28 DIAGNOSIS — Z3A32 32 weeks gestation of pregnancy: Secondary | ICD-10-CM

## 2024-08-28 DIAGNOSIS — O0933 Supervision of pregnancy with insufficient antenatal care, third trimester: Secondary | ICD-10-CM | POA: Insufficient documentation

## 2024-08-28 DIAGNOSIS — O24119 Pre-existing diabetes mellitus, type 2, in pregnancy, unspecified trimester: Secondary | ICD-10-CM | POA: Insufficient documentation

## 2024-08-28 NOTE — Progress Notes (Signed)
 Maternal-Fetal Medicine Consultation  Name: Sarah Koch  MRN: [id number]  GA: [id number] [redacted]w[redacted]d   Patient is here for fetal anatomy scan.  On cell-free fetal DNA screening, the risks of the aneuploidies are not increased. Problems include: - Gestational diabetes patient reports her fasting levels are below 95 mg/dL and postprandial levels are below 120, mg/dL.  Recent hemoglobin A1c was 5.3% and a year ago it was 5.4%.  She did not have diabetes after her last delivery. - Late prenatal care. - Short interval between pregnancies.  Obstetrical history significant for 2 term vaginal deliveries.  Both her pregnancies were complicated by gestational diabetes.  Prenatal care: Her pregnancy is dated by 14-week ultrasound where only femur length was measured.  Ultrasound Fetal biometry is consistent with 32 weeks and 4 days gestation.  Normal amniotic fluid.  Fetal anatomical survey appears normal but limited by advanced gestational age.  Antenatal testing is reassuring.  BPP 8/8.  I counseled the patient with the help of language interpreter ([employer] services).  Pregnancy dating I counseled the patient that her pregnancy by 14-week ultrasound cannot be considered for establishing EDD.  Based on today's ultrasound measurements, we have assigned her EDD at 10/19/2024.  I explained that third trimester pregnancy dating is suboptimal and is not very accurate.  Gestational diabetes I explained the diagnosis of gestational diabetes.  Based on her hemoglobin A1c, she does not have type 2 diabetes.  I emphasized the importance of good blood glucose control to prevent adverse fetal or neonatal outcomes.  I discussed normal range of blood glucose values. I encouraged her to check her blood glucose regularly.  Patient has not brought her logbook today. Possible complications of gestational diabetes include fetal macrosomia, shoulder dystocia and birth injuries, stillbirth (in poorly controlled diabetes) and neonatal  respiratory syndrome and other complications.  In about 85% of cases, gestational diabetes is well controlled by diet alone.  Exercise reduces the need for insulin.  Medical treatment includes oral hypoglycemics or insulin.  Timing of delivery: In well-controlled diabetes on diet, patient can be delivered at 60- or 40-weeks' gestation. Vaginal delivery can be safely attempted. Type 2 diabetes develops in up to 50% of women with GDM. I recommend postpartum screening with 75-g glucose load at 6 to 12 weeks after delivery.  Recommendations -Weekly NST at your office. - Fetal growth assessment and BPP in 4 weeks.     Consultation including face-to-face (more than 50%) counseling 35 minutes.

## 2024-08-29 ENCOUNTER — Encounter: Payer: Self-pay | Admitting: Obstetrics

## 2024-08-29 ENCOUNTER — Ambulatory Visit: Admitting: Obstetrics

## 2024-08-29 VITALS — BP 100/63 | HR 98 | Wt 202.8 lb

## 2024-08-29 DIAGNOSIS — O99213 Obesity complicating pregnancy, third trimester: Secondary | ICD-10-CM | POA: Diagnosis not present

## 2024-08-29 DIAGNOSIS — Z3A32 32 weeks gestation of pregnancy: Secondary | ICD-10-CM | POA: Diagnosis present

## 2024-08-29 DIAGNOSIS — L308 Other specified dermatitis: Secondary | ICD-10-CM

## 2024-08-29 DIAGNOSIS — Z758 Other problems related to medical facilities and other health care: Secondary | ICD-10-CM | POA: Diagnosis not present

## 2024-08-29 DIAGNOSIS — Z8632 Personal history of gestational diabetes: Secondary | ICD-10-CM

## 2024-08-29 DIAGNOSIS — O0993 Supervision of high risk pregnancy, unspecified, third trimester: Secondary | ICD-10-CM | POA: Diagnosis not present

## 2024-08-29 DIAGNOSIS — O0933 Supervision of pregnancy with insufficient antenatal care, third trimester: Secondary | ICD-10-CM | POA: Diagnosis present

## 2024-08-29 DIAGNOSIS — O0930 Supervision of pregnancy with insufficient antenatal care, unspecified trimester: Secondary | ICD-10-CM

## 2024-08-29 DIAGNOSIS — O09299 Supervision of pregnancy with other poor reproductive or obstetric history, unspecified trimester: Secondary | ICD-10-CM

## 2024-08-29 DIAGNOSIS — O24119 Pre-existing diabetes mellitus, type 2, in pregnancy, unspecified trimester: Secondary | ICD-10-CM | POA: Diagnosis present

## 2024-08-29 DIAGNOSIS — Z8759 Personal history of other complications of pregnancy, childbirth and the puerperium: Secondary | ICD-10-CM | POA: Diagnosis not present

## 2024-08-29 DIAGNOSIS — O99210 Obesity complicating pregnancy, unspecified trimester: Secondary | ICD-10-CM

## 2024-08-29 DIAGNOSIS — Z603 Acculturation difficulty: Secondary | ICD-10-CM

## 2024-08-29 DIAGNOSIS — O09893 Supervision of other high risk pregnancies, third trimester: Secondary | ICD-10-CM | POA: Diagnosis not present

## 2024-08-29 DIAGNOSIS — O09899 Supervision of other high risk pregnancies, unspecified trimester: Secondary | ICD-10-CM

## 2024-08-29 DIAGNOSIS — O09293 Supervision of pregnancy with other poor reproductive or obstetric history, third trimester: Secondary | ICD-10-CM | POA: Diagnosis not present

## 2024-08-29 DIAGNOSIS — O0990 Supervision of high risk pregnancy, unspecified, unspecified trimester: Secondary | ICD-10-CM

## 2024-08-29 MED ORDER — URSODIOL 300 MG PO CAPS: 300.0000 mg | ORAL_CAPSULE | Freq: Two times a day (BID) | ORAL | 5 refills | Status: DC

## 2024-08-29 MED ORDER — PREDNISONE 10 MG (21) PO TBPK: ORAL_TABLET | ORAL | 0 refills | Status: DC

## 2024-08-29 NOTE — Progress Notes (Signed)
 Subjective:  Sarah Koch is a 33 y.o. H5E7987 at [redacted]w[redacted]d being seen today for ongoing prenatal care.  She is currently monitored for the following issues for this high-risk pregnancy and has Obesity in pregnancy; Language barrier; History of gestational diabetes in prior pregnancy, currently pregnant; Short interval between pregnancies affecting pregnancy, antepartum; History of third degree perineal laceration; Late prenatal care affecting pregnancy; and Pre-existing type 2 diabetes affecting pregnancy, antepartum on their problem list.  Patient reports severe generalized itching .  Contractions: Not present. Vag. Bleeding: None.  Movement: Present. Denies leaking of fluid.   The following portions of the patient's history were reviewed and updated as appropriate: allergies, current medications, past family history, past medical history, past social history, past surgical history and problem list. Problem list updated.  Objective:   Vitals:   08/29/24 0852  BP: 100/63  Pulse: 98  Weight: 202 lb 12.8 oz (92 kg)    Fetal Status: Fetal Heart Rate (bpm): 138   Movement: Present     General:  Alert, oriented and cooperative. Patient is in no acute distress.  Skin: Skin is warm and dry. No rash noted.   Cardiovascular: Normal heart rate noted  Respiratory: Normal respiratory effort, no problems with respiration noted  Abdomen: Soft, gravid, appropriate for gestational age. Pain/Pressure: Present     Pelvic:  Cervical exam deferred        Extremities: Normal range of motion.  Edema: Trace  Mental Status: Normal mood and affect. Normal behavior. Normal judgment and thought content.   Urinalysis:      Assessment and Plan:  Pregnancy: H5E7987 at [redacted]w[redacted]d  1. Supervision of high risk pregnancy, antepartum (Primary)  2. Late prenatal care affecting pregnancy, antepartum  3. Short interval between pregnancies affecting pregnancy, antepartum  4. Language barrier - interpreter present  5.  History of gestational diabetes in prior pregnancy, currently pregnant  6. Pre-existing type 2 diabetes affecting pregnancy, antepartum - good glucose control:  FBS < 100   and   2 Hour PP < 120  7. History of third degree perineal laceration  8. Obesity in pregnancy  9. Pruritic dermatitis.  R/O Cholestasis Rx: - Bile acids, total - ursodiol (ACTIGALL) 300 MG capsule; Take 1 capsule (300 mg total) by mouth 2 (two) times daily.  Dispense: 60 capsule; Refill: 5 - predniSONE (STERAPRED UNI-PAK 21 TAB) 10 MG (21) TBPK tablet; Take as directed.  Dispense: 21 tablet; Refill: 0    Preterm labor symptoms and general obstetric precautions including but not limited to vaginal bleeding, contractions, leaking of fluid and fetal movement were reviewed in detail with the patient. Please refer to After Visit Summary for other counseling recommendations.   Return in about 2 weeks (around 09/12/2024) for [facility].   Rudy Carlin LABOR, MD 08/29/2024

## 2024-08-29 NOTE — Progress Notes (Signed)
 Pt presents for rob. Pt has no questions or concerns at this time.

## 2024-08-31 LAB — BILE ACIDS, TOTAL: Bile Acids Total: 3 umol/L (ref 0.0–10.0)

## 2024-09-01 ENCOUNTER — Other Ambulatory Visit: Payer: Self-pay | Admitting: *Deleted

## 2024-09-01 DIAGNOSIS — O24119 Pre-existing diabetes mellitus, type 2, in pregnancy, unspecified trimester: Secondary | ICD-10-CM

## 2024-09-12 ENCOUNTER — Encounter: Payer: Self-pay | Admitting: Obstetrics

## 2024-09-12 ENCOUNTER — Ambulatory Visit: Payer: Self-pay | Admitting: Obstetrics

## 2024-09-12 VITALS — BP 111/75 | HR 106 | Wt 203.0 lb

## 2024-09-12 DIAGNOSIS — O99213 Obesity complicating pregnancy, third trimester: Secondary | ICD-10-CM

## 2024-09-12 DIAGNOSIS — O09893 Supervision of other high risk pregnancies, third trimester: Secondary | ICD-10-CM

## 2024-09-12 DIAGNOSIS — Z603 Acculturation difficulty: Secondary | ICD-10-CM

## 2024-09-12 DIAGNOSIS — O24119 Pre-existing diabetes mellitus, type 2, in pregnancy, unspecified trimester: Secondary | ICD-10-CM

## 2024-09-12 DIAGNOSIS — O0933 Supervision of pregnancy with insufficient antenatal care, third trimester: Secondary | ICD-10-CM | POA: Diagnosis not present

## 2024-09-12 DIAGNOSIS — Z758 Other problems related to medical facilities and other health care: Secondary | ICD-10-CM

## 2024-09-12 DIAGNOSIS — O24113 Pre-existing diabetes mellitus, type 2, in pregnancy, third trimester: Secondary | ICD-10-CM | POA: Diagnosis not present

## 2024-09-12 DIAGNOSIS — Z3A34 34 weeks gestation of pregnancy: Secondary | ICD-10-CM | POA: Diagnosis not present

## 2024-09-12 DIAGNOSIS — O09899 Supervision of other high risk pregnancies, unspecified trimester: Secondary | ICD-10-CM

## 2024-09-12 DIAGNOSIS — O0930 Supervision of pregnancy with insufficient antenatal care, unspecified trimester: Secondary | ICD-10-CM

## 2024-09-12 DIAGNOSIS — O0990 Supervision of high risk pregnancy, unspecified, unspecified trimester: Secondary | ICD-10-CM

## 2024-09-12 DIAGNOSIS — O0993 Supervision of high risk pregnancy, unspecified, third trimester: Secondary | ICD-10-CM | POA: Diagnosis not present

## 2024-09-12 DIAGNOSIS — O99210 Obesity complicating pregnancy, unspecified trimester: Secondary | ICD-10-CM

## 2024-09-12 DIAGNOSIS — Z8759 Personal history of other complications of pregnancy, childbirth and the puerperium: Secondary | ICD-10-CM

## 2024-09-12 MED ORDER — NATACHEW 28-1 MG PO CHEW: 1.0000 | CHEWABLE_TABLET | Freq: Every day | ORAL | 11 refills | Status: AC

## 2024-09-12 NOTE — Progress Notes (Signed)
 Sarah Koch presents for [facility] and NST. Antenatal testing indicated due to diet controlled T2DM per MFM recommendations. EFM applied. FM palpated. See Fetal Testing for NST results.

## 2024-09-12 NOTE — Progress Notes (Signed)
 Subjective:  Sarah Koch is a 33 y.o. H5E7987 at [redacted]w[redacted]d being seen today for ongoing prenatal care.  She is currently monitored for the following issues for this high-risk pregnancy and has Obesity in pregnancy; Language barrier; History of gestational diabetes in prior pregnancy, currently pregnant; Short interval between pregnancies affecting pregnancy, antepartum; History of third degree perineal laceration; Late prenatal care affecting pregnancy; and Pre-existing type 2 diabetes affecting pregnancy, antepartum on their problem list.  Patient reports pruritus better after stopping PNV's.  She is also not taking Ursodiol .  Contractions: Not present. Vag. Bleeding: None.  Movement: Present. Denies leaking of fluid.   The following portions of the patient's history were reviewed and updated as appropriate: allergies, current medications, past family history, past medical history, past social history, past surgical history and problem list. Problem list updated.  Objective:   Vitals:   09/12/24 1025  BP: 111/75  Pulse: (!) 106  Weight: 203 lb (92.1 kg)    Fetal Status: Fetal Heart Rate (bpm): 150   Movement: Present     General:  Alert, oriented and cooperative. Patient is in no acute distress.  Skin: Skin is warm and dry. No rash noted.   Cardiovascular: Normal heart rate noted  Respiratory: Normal respiratory effort, no problems with respiration noted  Abdomen: Soft, gravid, appropriate for gestational age. Pain/Pressure: Absent     Pelvic:  Cervical exam deferred        Extremities: Normal range of motion.     Mental Status: Normal mood and affect. Normal behavior. Normal judgment and thought content.   Urinalysis:      Assessment and Plan:  Pregnancy: H5E7987 at [redacted]w[redacted]d  1. Supervision of high risk pregnancy, antepartum (Primary) Rx: - Prenatal Vit-Fe Fum-Fe Bisg-FA (NATACHEW ) 28-1 MG CHEW; Chew 1 each by mouth daily.  Dispense: 30 tablet; Refill: 11  2. Late prenatal care  affecting pregnancy, antepartum  3. Language barrier - interpreter present  4. Short interval between pregnancies affecting pregnancy, antepartum  5. Pre-existing type 2 diabetes affecting pregnancy, antepartum - good glucose control:  FBS < 100   and   2 Hour PP < 120  6. History of third degree perineal laceration  7. Obesity in pregnancy    Preterm labor symptoms and general obstetric precautions including but not limited to vaginal bleeding, contractions, leaking of fluid and fetal movement were reviewed in detail with the patient. Please refer to After Visit Summary for other counseling recommendations.   Return in about 1 week (around 09/19/2024) for [facility].  NST.   Rudy Carlin LABOR, MD 09/12/2024

## 2024-09-12 NOTE — Progress Notes (Signed)
 Pt states she is only taking prenatal vitamin, not taking Ursodiol . Pt stopped taking Prednisone  due to increase in blood sugar.  Pt states she did not take her PNV for a few days and itching was better.

## 2024-09-16 ENCOUNTER — Encounter (HOSPITAL_COMMUNITY): Payer: Self-pay | Admitting: Obstetrics and Gynecology

## 2024-09-16 ENCOUNTER — Ambulatory Visit: Payer: Self-pay

## 2024-09-16 ENCOUNTER — Other Ambulatory Visit: Payer: Self-pay

## 2024-09-16 ENCOUNTER — Inpatient Hospital Stay (HOSPITAL_COMMUNITY)

## 2024-09-16 ENCOUNTER — Inpatient Hospital Stay (HOSPITAL_COMMUNITY)
Admission: AD | Admit: 2024-09-16 | Discharge: 2024-09-16 | Disposition: A | Discharge status: Home / Self Care | Attending: Obstetrics and Gynecology | Admitting: Obstetrics and Gynecology

## 2024-09-16 VITALS — BP 95/65 | HR 93 | Wt 206.0 lb

## 2024-09-16 DIAGNOSIS — Z3A35 35 weeks gestation of pregnancy: Secondary | ICD-10-CM

## 2024-09-16 DIAGNOSIS — O99213 Obesity complicating pregnancy, third trimester: Secondary | ICD-10-CM

## 2024-09-16 DIAGNOSIS — O0933 Supervision of pregnancy with insufficient antenatal care, third trimester: Secondary | ICD-10-CM | POA: Diagnosis not present

## 2024-09-16 DIAGNOSIS — O24113 Pre-existing diabetes mellitus, type 2, in pregnancy, third trimester: Secondary | ICD-10-CM

## 2024-09-16 DIAGNOSIS — O09893 Supervision of other high risk pregnancies, third trimester: Secondary | ICD-10-CM | POA: Insufficient documentation

## 2024-09-16 DIAGNOSIS — O288 Other abnormal findings on antenatal screening of mother: Secondary | ICD-10-CM

## 2024-09-16 DIAGNOSIS — Z3689 Encounter for other specified antenatal screening: Secondary | ICD-10-CM | POA: Diagnosis present

## 2024-09-16 DIAGNOSIS — O24313 Unspecified pre-existing diabetes mellitus in pregnancy, third trimester: Secondary | ICD-10-CM

## 2024-09-16 DIAGNOSIS — E669 Obesity, unspecified: Secondary | ICD-10-CM | POA: Diagnosis not present

## 2024-09-16 DIAGNOSIS — O09523 Supervision of elderly multigravida, third trimester: Secondary | ICD-10-CM | POA: Diagnosis not present

## 2024-09-16 DIAGNOSIS — O24119 Pre-existing diabetes mellitus, type 2, in pregnancy, unspecified trimester: Secondary | ICD-10-CM

## 2024-09-16 DIAGNOSIS — O99210 Obesity complicating pregnancy, unspecified trimester: Secondary | ICD-10-CM

## 2024-09-16 DIAGNOSIS — O289 Unspecified abnormal findings on antenatal screening of mother: Secondary | ICD-10-CM | POA: Diagnosis present

## 2024-09-16 DIAGNOSIS — O0993 Supervision of high risk pregnancy, unspecified, third trimester: Secondary | ICD-10-CM | POA: Diagnosis not present

## 2024-09-16 DIAGNOSIS — O24410 Gestational diabetes mellitus in pregnancy, diet controlled: Secondary | ICD-10-CM | POA: Diagnosis not present

## 2024-09-16 DIAGNOSIS — O0990 Supervision of high risk pregnancy, unspecified, unspecified trimester: Secondary | ICD-10-CM

## 2024-09-16 NOTE — MAU Provider Note (Signed)
 [employer]  Interpreter AMN used during entire encounter   S/HPI [name] is a 33 y.o. H5E7987 patient who presents to MAU today with complaint of sent from the office for nonreactive NST.  Patient was sent for the from the office for more fetal testing due to nonreactive NST in the office with a history of pre-existing type 2 diabetes affecting pregnancy.  She receives her prenatal care at [facility] and her pregnancy is complicated by obesity, language barrier, short interval between pregnancies, late prenatal care, T2 diabetes, AMA.  I spoke with the physician Dr. Alger who also requested a BPP.    Patient offers no complaints at this time.  She denies any vaginal bleeding, leaking of fluid, contractions, and reports good fetal movements   O BP 101/60   Pulse 96   Temp 98.6 F (37 C) (Oral)   Resp 18   LMP 01/26/2024 (Exact Date)   SpO2 100%  Physical Exam Vitals and nursing note reviewed.  Constitutional:      General: She is not in acute distress.    Appearance: Normal appearance. She is obese. She is not ill-appearing.  HENT:     Head: Normocephalic.  Cardiovascular:     Rate and Rhythm: Normal rate.  Pulmonary:     Effort: Pulmonary effort is normal.  Abdominal:     General: There is no distension.     Palpations: Abdomen is soft.     Tenderness: There is no abdominal tenderness.  Musculoskeletal:        General: Normal range of motion.     Cervical back: Normal range of motion.  Skin:    General: Skin is warm.  Neurological:     Mental Status: She is oriented to person, place, and time.  Psychiatric:        Mood and Affect: Mood normal.        Behavior: Behavior normal.     MDM  MODERATE   NST reactive  Baseline : 140-145 Variability: Moderate  Accels: present Decels: absent TOCO: UI  BPP 8/8 per verbal report from Technician and I also personally reviewed all of the ultrasound images independently.   Study Result  Narrative & Impression     ----------------------------------------------------------------------  OBSTETRICS REPORT                       (Signed Final 09/16/2024 05:18 pm) ---------------------------------------------------------------------- Patient Info    ID #:       [id number]                          D.O.B.:  1991/08/16 (33 yrs)(F)  Name:       Sarah Koch                  Visit Date: 09/16/2024 12:26 pm ---------------------------------------------------------------------- Performed By    Attending:        Steffan Keys MD         Ref. Address:     [address]                                                             [address]  [place], [place]                                                             [address]  Performed By:     Rosina Mayotte           [facility]  Referred By:      [facility] ---------------------------------------------------------------------- Orders    #  Description                           Code        Ordered By  1  US  MFM FETAL BPP WO NON               23180.98    Aminta Sakurai     STRESS ----------------------------------------------------------------------    #  Order #                     Accession #                Episode #  1  [id number]                   [id number]                 [id number] ---------------------------------------------------------------------- Indications    Non-reactive NST                               O28.9  Gestational diabetes in pregnancy, diet        O24.410  controlled  [redacted] weeks gestation of pregnancy                Z3A.35  Short interval between pregnancies, 3rd        O09.893  trimester  Late to prenatal care, third trimester         O09.33  Obesity complicating pregnancy, third          O99.213  trimester (BMI 32)  History of third degree perineal laceration  LR  NIPS ---------------------------------------------------------------------- Fetal Evaluation    Num Of Fetuses:         1  Fetal Heart Rate(bpm):  140  Cardiac Activity:       Observed  Presentation:           Cephalic  Placenta:               Anterior  P. Cord Insertion:      Previously seen    Amniotic Fluid  AFI FV:      Within normal limits    AFI Sum(cm)     %Tile       Largest Pocket(cm)  11.7            33  4.4    RUQ(cm)       RLQ(cm)       LUQ(cm)        LLQ(cm)  1.9           3.2           4.4            2.2 ---------------------------------------------------------------------- Biophysical Evaluation  Amniotic F.V:   Pocket => 2 cm             F. Tone:        Observed  F. Movement:    Observed                   Score:          8/8  F. Breathing:   Observed ---------------------------------------------------------------------- Biometry    LV:        2.3  mm ---------------------------------------------------------------------- Gestational Age    Best:          35w 2d     Det. By:  U/S  (08/28/24)          EDD:   10/19/24 ---------------------------------------------------------------------- Anatomy    Ventricles:            Appears normal         Stomach:                Appears normal, left                                                                        sided  Heart:                 Appears normal         Bladder:                Appears normal                         (4CH, axis, and                         situs)  Diaphragm:             Appears normal ---------------------------------------------------------------------- Cervix Uterus Adnexa    Cervix  Not visualized (advanced GA >24wks)    Uterus  No abnormality visualized.    Right Ovary  Not visualized.    Left Ovary  Within normal limits.    Cul De Sac  No free fluid seen.    Adnexa  No abnormality  visualized ---------------------------------------------------------------------- Comments    This patient was sent to the MAU for evaluation following a  nonreactive NST in the office.    Sonographic findings  Single intrauterine pregnancy at 35w 2d.  Fetal cardiac activity: Observed.  Presentation: Cephalic.  Amniotic fluid: Within normal limits. AFI: 11.7 cm,  MVP: 4.4  cm.  Placenta: Anterior.  BPP: 8/8.   ----------------------------------------------------------------------                   Steffan Keys, MD Electronically Signed Final Report   09/16/2024 05:18 pm ----------------------------------------------------------------------         Orders Placed This Encounter  Procedures   US   MFM FETAL BPP WO NON STRESS    Non reactive NST in office , T2 DM    Standing Status:   Standing    Number of Occurrences:   1    Symptom/Reason for Exam:   Non-reactive NST (non-stress test) [633036]   Discharge patient Discharge disposition: 01-Home or Self Care; Discharge patient date: 09/16/2024    Standing Status:   Standing    Number of Occurrences:   1    Discharge disposition:   01-Home or Self Care [1]    Discharge patient date:   09/16/2024        ASSESSMENT Medical screening exam complete  Pre-existing type 2 diabetes affecting pregnancy, antepartum  Non-reactive NST (non-stress test)  Obesity in pregnancy  Unsp pre-existing diabetes in pregnancy, third trimester  [redacted] weeks gestation of pregnancy  NST (non-stress test) reactive on fetal surveillance     PLAN Future Appointments  Date Time Provider Department Center  09/23/2024  8:15 AM [facility]  09/29/2024 11:15 AM [facility] PROVIDER 1 [facility] [place]  09/29/2024 11:30 AM [facility] US4 [facility]  09/30/2024  1:50 PM Eveline Lynwood MATSU, MD [facility] None    Discharge from MAU in stable condition  Reassuring Fetal testing with BPP 10/10  Fetal kick counts reviewed  F/U as scheduled   See AVS for  full description of educational information and instructions provided to the patient at time of discharge  Warning signs for worsening condition that would warrant emergency follow-up discussed  Patient may return to MAU as needed   Littie Olam LABOR, NP 09/16/2024 12:32 PM

## 2024-09-16 NOTE — MAU Note (Signed)
 Sarah Koch is a 33 y.o. at [redacted]w[redacted]d here in MAU reporting: she was sent from [facility] office for monitoring, reports they didn't see a good sign.  Denies VB and LOF.  Endorses +FM.  LMP: 01/26/2024 Onset of complaint: today Pain score: 0 Vitals:   09/16/24 1016  BP: 104/60  Pulse: 94  Resp: 18  Temp: 98.6 F (37 C)  SpO2: 100%     FHT: 146 bpm  Lab orders placed from triage: None

## 2024-09-16 NOTE — Progress Notes (Signed)
 Pt is in the office for NST only Reports BG this morning as 99 Reports fetal movement, denies pain NST reviewed by Dr. Alger in office, advised pt to report to MAU for further monitoring.

## 2024-09-18 NOTE — L&D Delivery Note (Signed)
 OB/GYN Faculty Practice Delivery Note  Sarah Koch is a 34 y.o. H5E7987 s/p SVD at [redacted]w[redacted]d. She was admitted for IOL for GDM.   ROM: 14h 60m with clear fluid GBS Status:  Negative/-- (01/13 1539) Maximum Maternal Temperature: Temp (48hrs), Avg:98.7 F (37.1 C), Min:97.6 F (36.4 C), Max:100 F (37.8 C)   Labor Progress: Initial SVE: 1/30/-2. She received  cytotec , pitocin , AROM and she then progressed to complete.   Delivery Date/Time: 10/13/24 15:26 Delivery: Called to room and patient was complete and pushing. Head delivered LOA. No nuchal cord present. Shoulder and body delivered in usual fashion. Infant with spontaneous cry, placed on mother's abdomen, dried and stimulated. Cord clamped x 2 after 1-minute delay, and cut by father of baby (Tesfu). Cord blood  collected. Placenta delivered spontaneously with gentle cord traction. Fundus firm with massage and Pitocin . Labia, perineum, vagina, and cervix inspected with no lacerations.  Baby Weight: pending  Placenta: 3 vessel, intact. Sent to L&D Complications: None Lacerations: None EBL: 20 mL Analgesia: Epidural   Infant:  APGAR (1 MIN):  8 APGAR (5 MINS):  9  Suzen Maryan Masters, MD [facility] [employer] [facility] 10/13/2024, 3:36 PM

## 2024-09-23 ENCOUNTER — Ambulatory Visit (INDEPENDENT_AMBULATORY_CARE_PROVIDER_SITE_OTHER): Payer: Self-pay | Admitting: Dietician

## 2024-09-23 ENCOUNTER — Other Ambulatory Visit: Payer: Self-pay

## 2024-09-23 ENCOUNTER — Encounter: Attending: Obstetrics and Gynecology | Admitting: Dietician

## 2024-09-23 DIAGNOSIS — Z3A36 36 weeks gestation of pregnancy: Secondary | ICD-10-CM

## 2024-09-23 DIAGNOSIS — Z3A00 Weeks of gestation of pregnancy not specified: Secondary | ICD-10-CM | POA: Insufficient documentation

## 2024-09-23 DIAGNOSIS — O0990 Supervision of high risk pregnancy, unspecified, unspecified trimester: Secondary | ICD-10-CM | POA: Diagnosis present

## 2024-09-23 DIAGNOSIS — Z713 Dietary counseling and surveillance: Secondary | ICD-10-CM | POA: Insufficient documentation

## 2024-09-23 DIAGNOSIS — O24119 Pre-existing diabetes mellitus, type 2, in pregnancy, unspecified trimester: Secondary | ICD-10-CM

## 2024-09-23 NOTE — Progress Notes (Signed)
 Patient was seen for Gestational Diabetes on 08/07/2024 Start time 0825 and End time 0855  Estimated due date: 10/20/2024; [redacted]w[redacted]d  [employer] Interpreter from [employer]; #[id number] [place] Patient was in the hospital on 09/16/2024 due to fetal test. Fasting blood glucose has started to consistently rise to  to 100-110.  Blood glucose readings after her meals are mostly normal except for 12/12 and 12/13 which patient states was due to side effects of a medication used for a rash.  Clinical: Medications:  Current Outpatient Medications:    Accu-Chek Softclix Lancets lancets, Check blood sugars four times daily; when waking up and 2 hours after breakfast, lunch, and dinner., Disp: 100 each, Rfl: 12   acetaminophen  (TYLENOL ) 325 MG tablet, Take 2 tablets (650 mg total) by mouth every 4 (four) hours as needed (for pain scale < 4). (Patient not taking: Reported on 08/28/2024), Disp: 30 tablet, Rfl: 0   Blood Glucose Monitoring Suppl (ACCU-CHEK GUIDE) w/Device KIT, 1 Device by Does not apply route 4 (four) times daily., Disp: 1 kit, Rfl: 0   cetirizine  (ZYRTEC ) 5 MG tablet, Take 1 tablet (5 mg total) by mouth daily. (Patient not taking: Reported on 08/28/2024), Disp: 30 tablet, Rfl: 3   glucose blood test strip, Check blood sugars four times daily; when waking up and 2 hours after breakfast, lunch, and dinner., Disp: 100 each, Rfl: 12   hydrocortisone  2.5 % cream, Apply topically 2 (two) times daily., Disp: 30 g, Rfl: 2   Prenatal Vit-Fe Fum-Fe Bisg-FA (NATACHEW ) 28-1 MG CHEW, Chew 1 each by mouth daily., Disp: 30 tablet, Rfl: 11  Medical History:  Past Medical History:  Diagnosis Date   GBS (group B Streptococcus carrier), +RV culture, currently pregnant 10/13/2021   Gestational diabetes mellitus (GDM) in third trimester 08/15/2021   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  Drysol was ineffective.  Await lab  results.  If not pregnant, would consider a trial of oxybutynin 5 mg 3 times daily.   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 Pt reports she does the cooking and shopping for a family of four. Pt avoids pork. Pt reports typical intake of 3 meals and 2 snacks daily.   All Pt's questions were answered during this encounter.   Physical Activity: walks for 10-15 minutes 2 times daily Stress: 3-4 out of 10/ self care includes: rest Sleep: normal  24 hr Recall:  First Meal: fried eggs, tea, 2 tsp sugar Snack: none Second meal: vegetables, lentils, injura Snack: almonds Third meal: vegetables, lentils, white rice Snack: none Beverages:  water, black tea with 1 tsp sugar, coffee with sugar  NUTRITION INTERVENTION  Nutrition education (E-1) on the following topics:   Initial Follow-up  [x]  []  Definition of Gestational Diabetes [x]  [x]  Why dietary management is important in controlling blood glucose [x]  [x]  Effects each nutrient has on blood glucose levels [x]  []  Simple carbohydrates vs complex carbohydrates [x]  []  Fluid intake [x]  [x]  Creating a balanced meal plan []  []  Carbohydrate counting  [x]  []  When to check blood  glucose levels [x]  []  Proper blood glucose monitoring techniques [x]  []  Effect of stress and stress reduction techniques  [x]  [x]  Exercise effect on blood glucose levels, appropriate exercise during pregnancy [x]  []  Importance of limiting caffeine and abstaining from alcohol and smoking [x]  []  Medications used for blood sugar control during pregnancy [x]  []  Hypoglycemia and rule of 15 [x]  [x]  Postpartum self care  Patient has  a meter prior to visit. Patient is instructed to continue testing pre breakfast and 2 hours after each meal.   Patient instructed to monitor glucose levels: QID FBS: 60 - <= 95 mg/dL; 2 hour: <= 879 mg/dL   Patient handouts reviewed: Blood glucose log-provided today  [email or web address] Meal Plan Basics- [place] Patient and Family Education-[place] [facility]   Patient will be seen for follow-up : prn

## 2024-09-29 ENCOUNTER — Other Ambulatory Visit: Payer: Self-pay | Admitting: Obstetrics and Gynecology

## 2024-09-29 ENCOUNTER — Ambulatory Visit: Attending: Obstetrics and Gynecology

## 2024-09-29 ENCOUNTER — Ambulatory Visit (HOSPITAL_BASED_OUTPATIENT_CLINIC_OR_DEPARTMENT_OTHER): Admitting: Obstetrics and Gynecology

## 2024-09-29 VITALS — BP 96/59 | HR 98

## 2024-09-29 DIAGNOSIS — O09899 Supervision of other high risk pregnancies, unspecified trimester: Secondary | ICD-10-CM

## 2024-09-29 DIAGNOSIS — Z3A37 37 weeks gestation of pregnancy: Secondary | ICD-10-CM

## 2024-09-29 DIAGNOSIS — O24119 Pre-existing diabetes mellitus, type 2, in pregnancy, unspecified trimester: Secondary | ICD-10-CM | POA: Insufficient documentation

## 2024-09-29 DIAGNOSIS — O0933 Supervision of pregnancy with insufficient antenatal care, third trimester: Secondary | ICD-10-CM | POA: Diagnosis not present

## 2024-09-29 DIAGNOSIS — E669 Obesity, unspecified: Secondary | ICD-10-CM

## 2024-09-29 DIAGNOSIS — O99213 Obesity complicating pregnancy, third trimester: Secondary | ICD-10-CM | POA: Diagnosis not present

## 2024-09-29 DIAGNOSIS — O24410 Gestational diabetes mellitus in pregnancy, diet controlled: Secondary | ICD-10-CM

## 2024-09-29 NOTE — Progress Notes (Signed)
 After review, MFM consult with provider is not indicated for today  Arna Ranks, MD 09/29/2024 12:05 PM  [facility]

## 2024-09-30 ENCOUNTER — Other Ambulatory Visit (HOSPITAL_COMMUNITY)
Admission: RE | Admit: 2024-09-30 | Discharge: 2024-09-30 | Disposition: A | Discharge status: Home / Self Care | Source: Ambulatory Visit | Attending: Obstetrics & Gynecology | Admitting: Obstetrics & Gynecology

## 2024-09-30 ENCOUNTER — Ambulatory Visit: Payer: Self-pay | Admitting: Obstetrics & Gynecology

## 2024-09-30 VITALS — BP 108/73 | HR 103 | Wt 226.0 lb

## 2024-09-30 DIAGNOSIS — O09293 Supervision of pregnancy with other poor reproductive or obstetric history, third trimester: Secondary | ICD-10-CM

## 2024-09-30 DIAGNOSIS — Z3A37 37 weeks gestation of pregnancy: Secondary | ICD-10-CM

## 2024-09-30 DIAGNOSIS — Z8632 Personal history of gestational diabetes: Secondary | ICD-10-CM

## 2024-09-30 DIAGNOSIS — O09299 Supervision of pregnancy with other poor reproductive or obstetric history, unspecified trimester: Secondary | ICD-10-CM

## 2024-09-30 DIAGNOSIS — Z758 Other problems related to medical facilities and other health care: Secondary | ICD-10-CM

## 2024-09-30 DIAGNOSIS — O24119 Pre-existing diabetes mellitus, type 2, in pregnancy, unspecified trimester: Secondary | ICD-10-CM

## 2024-09-30 DIAGNOSIS — O0990 Supervision of high risk pregnancy, unspecified, unspecified trimester: Secondary | ICD-10-CM | POA: Insufficient documentation

## 2024-09-30 DIAGNOSIS — Z603 Acculturation difficulty: Secondary | ICD-10-CM | POA: Diagnosis not present

## 2024-09-30 DIAGNOSIS — O0993 Supervision of high risk pregnancy, unspecified, third trimester: Secondary | ICD-10-CM

## 2024-09-30 DIAGNOSIS — O24113 Pre-existing diabetes mellitus, type 2, in pregnancy, third trimester: Secondary | ICD-10-CM | POA: Diagnosis not present

## 2024-09-30 NOTE — Progress Notes (Signed)
 "  PRENATAL VISIT NOTE  Subjective:  Sarah Koch is a 34 y.o. H5E7987 at [redacted]w[redacted]d being seen today for ongoing prenatal care.  She is currently monitored for the following issues for this high-risk pregnancy and has Obesity in pregnancy; Language barrier; History of gestational diabetes in prior pregnancy, currently pregnant; Short interval between pregnancies affecting pregnancy, antepartum; History of third degree perineal laceration; Late prenatal care affecting pregnancy; Pre-existing type 2 diabetes affecting pregnancy, antepartum; and Non-reactive NST (non-stress test) on their problem list.  Patient reports no complaints.  Contractions: Not present. Vag. Bleeding: None.  Movement: Present. Denies leaking of fluid.   The following portions of the patient's history were reviewed and updated as appropriate: allergies, current medications, past family history, past medical history, past social history, past surgical history and problem list.   Objective:   Vitals:   09/30/24 1356  BP: 108/73  Pulse: (!) 103  Weight: 226 lb (102.5 kg)    Fetal Status:  Fetal Heart Rate (bpm): 150   Movement: Present Presentation: Vertex  General: Alert, oriented and cooperative. Patient is in no acute distress.  Skin: Skin is warm and dry. No rash noted.   Cardiovascular: Normal heart rate noted  Respiratory: Normal respiratory effort, no problems with respiration noted  Abdomen: Soft, gravid, appropriate for gestational age.  Pain/Pressure: Absent     Pelvic: Cervical exam performed in the presence of a chaperone Dilation: Closed Effacement (%): 10 Station: Ballotable  Extremities: Normal range of motion.  Edema: None  Mental Status: Normal mood and affect. Normal behavior. Normal judgment and thought content.      08/13/2024    9:01 AM 05/06/2024   11:22 AM 02/05/2023   11:24 AM  Depression screen PHQ 2/9  Decreased Interest 0 0 0  Down, Depressed, Hopeless 0 0 0  PHQ - 2 Score 0 0 0  Altered  sleeping 0 0   Tired, decreased energy 0 0   Change in appetite 0 0   Feeling bad or failure about yourself  0 0   Trouble concentrating 0 0   Moving slowly or fidgety/restless 0 0   Suicidal thoughts 0 0   PHQ-9 Score 0 0       Data saved with a previous flowsheet row definition        08/13/2024    9:01 AM 05/06/2024   11:23 AM 09/05/2022    1:38 PM 10/17/2021    3:24 PM  GAD 7 : Generalized Anxiety Score  Nervous, Anxious, on Edge 0 0 0 0  Control/stop worrying 0 0 0 0  Worry too much - different things 0 0 0 0  Trouble relaxing 0 0 0 0  Restless 0 0 0 0  Easily annoyed or irritable 0 0 0 0  Afraid - awful might happen 0 0 0 0  Total GAD 7 Score 0 0 0 0  Anxiety Difficulty    Not difficult at all    Assessment and Plan:  Pregnancy: H5E7987 at [redacted]w[redacted]d 1. Supervision of high risk pregnancy, antepartum (Primary)  - Culture, beta strep (group b only) - Cervicovaginal ancillary only( [facility])  2. [redacted] weeks gestation of pregnancy   3. Language barrier Interpreter via video  4. History of gestational diabetes in prior pregnancy, currently pregnant FBS and PP in normal range  5. Pre-existing type 2 diabetes affecting pregnancy, antepartum IOL 39 weeks  Term labor symptoms and general obstetric precautions including but not limited to vaginal bleeding, contractions,  leaking of fluid and fetal movement were reviewed in detail with the patient. Please refer to After Visit Summary for other counseling recommendations.   Return in about 1 week (around 10/07/2024).  No future appointments.  Lynwood Solomons, MD  "

## 2024-09-30 NOTE — Progress Notes (Addendum)
 ROB/GBS.  Pt is requesting different PNV because the current one gives her a rash.

## 2024-10-01 LAB — CERVICOVAGINAL ANCILLARY ONLY
Chlamydia: NEGATIVE
Comment: NEGATIVE
Comment: NORMAL
Neisseria Gonorrhea: NEGATIVE

## 2024-10-02 ENCOUNTER — Ambulatory Visit: Admitting: Obstetrics and Gynecology

## 2024-10-04 LAB — CULTURE, BETA STREP (GROUP B ONLY): Strep Gp B Culture: NEGATIVE

## 2024-10-07 ENCOUNTER — Ambulatory Visit: Payer: Self-pay | Admitting: Obstetrics and Gynecology

## 2024-10-07 ENCOUNTER — Telehealth (HOSPITAL_COMMUNITY): Payer: Self-pay | Admitting: *Deleted

## 2024-10-07 ENCOUNTER — Encounter: Payer: Self-pay | Admitting: Obstetrics and Gynecology

## 2024-10-07 VITALS — BP 106/71 | HR 106 | Wt 207.0 lb

## 2024-10-07 DIAGNOSIS — O0993 Supervision of high risk pregnancy, unspecified, third trimester: Secondary | ICD-10-CM | POA: Diagnosis not present

## 2024-10-07 DIAGNOSIS — Z8759 Personal history of other complications of pregnancy, childbirth and the puerperium: Secondary | ICD-10-CM

## 2024-10-07 DIAGNOSIS — Z3A38 38 weeks gestation of pregnancy: Secondary | ICD-10-CM | POA: Diagnosis not present

## 2024-10-07 DIAGNOSIS — Z758 Other problems related to medical facilities and other health care: Secondary | ICD-10-CM

## 2024-10-07 DIAGNOSIS — O99210 Obesity complicating pregnancy, unspecified trimester: Secondary | ICD-10-CM

## 2024-10-07 DIAGNOSIS — O24113 Pre-existing diabetes mellitus, type 2, in pregnancy, third trimester: Secondary | ICD-10-CM | POA: Diagnosis not present

## 2024-10-07 DIAGNOSIS — O24119 Pre-existing diabetes mellitus, type 2, in pregnancy, unspecified trimester: Secondary | ICD-10-CM

## 2024-10-07 DIAGNOSIS — O09893 Supervision of other high risk pregnancies, third trimester: Secondary | ICD-10-CM | POA: Diagnosis not present

## 2024-10-07 DIAGNOSIS — Z603 Acculturation difficulty: Secondary | ICD-10-CM | POA: Diagnosis not present

## 2024-10-07 DIAGNOSIS — O0990 Supervision of high risk pregnancy, unspecified, unspecified trimester: Secondary | ICD-10-CM

## 2024-10-07 DIAGNOSIS — O99213 Obesity complicating pregnancy, third trimester: Secondary | ICD-10-CM

## 2024-10-07 DIAGNOSIS — O09899 Supervision of other high risk pregnancies, unspecified trimester: Secondary | ICD-10-CM

## 2024-10-07 NOTE — Progress Notes (Signed)
"  Due to language barrier, an interpreter was present during the history-taking and subsequent discussion (and for part of the physical exam) with this patient.       "

## 2024-10-07 NOTE — Telephone Encounter (Signed)
 Preadmission screen [phone] interpreter number

## 2024-10-07 NOTE — Progress Notes (Signed)
 "  PRENATAL VISIT NOTE  Subjective:  Sarah Koch is a 34 y.o. H5E7987 at [redacted]w[redacted]d being seen today for ongoing prenatal care.  She is currently monitored for the following issues for this high-risk pregnancy and has Obesity in pregnancy; Language barrier; History of gestational diabetes in prior pregnancy, currently pregnant; Short interval between pregnancies affecting pregnancy, antepartum; History of third degree perineal laceration; Late prenatal care affecting pregnancy; Pre-existing type 2 diabetes affecting pregnancy, antepartum; and Non-reactive NST (non-stress test) on their problem list.  Patient reports no complaints.  Contractions: Not present.  .  Movement: Present. Denies leaking of fluid.   The following portions of the patient's history were reviewed and updated as appropriate: allergies, current medications, past family history, past medical history, past social history, past surgical history and problem list.   Objective:   Vitals:   10/07/24 0858  BP: 106/71  Pulse: (!) 106  Weight: 207 lb (93.9 kg)    Fetal Status:      Movement: Present    General: Alert, oriented and cooperative. Patient is in no acute distress.  Skin: Skin is warm and dry. No rash noted.   Cardiovascular: Normal heart rate noted  Respiratory: Normal respiratory effort, no problems with respiration noted  Abdomen: Soft, gravid, appropriate for gestational age.  Pain/Pressure: Absent     Pelvic: Cervical exam deferred        Extremities: Normal range of motion.  Edema: None  Mental Status: Normal mood and affect. Normal behavior. Normal judgment and thought content.      08/13/2024    9:01 AM 05/06/2024   11:22 AM 02/05/2023   11:24 AM  Depression screen PHQ 2/9  Decreased Interest 0 0 0  Down, Depressed, Hopeless 0 0 0  PHQ - 2 Score 0 0 0  Altered sleeping 0 0   Tired, decreased energy 0 0   Change in appetite 0 0   Feeling bad or failure about yourself  0 0   Trouble concentrating 0 0    Moving slowly or fidgety/restless 0 0   Suicidal thoughts 0 0   PHQ-9 Score 0 0       Data saved with a previous flowsheet row definition        08/13/2024    9:01 AM 05/06/2024   11:23 AM 09/05/2022    1:38 PM 10/17/2021    3:24 PM  GAD 7 : Generalized Anxiety Score  Nervous, Anxious, on Edge 0  0  0  0   Control/stop worrying 0  0  0  0   Worry too much - different things 0  0  0  0   Trouble relaxing 0  0  0  0   Restless 0  0  0  0   Easily annoyed or irritable 0  0  0  0   Afraid - awful might happen 0  0  0  0   Total GAD 7 Score 0 0 0 0  Anxiety Difficulty    Not difficult at all     Data saved with a previous flowsheet row definition    Assessment and Plan:  Pregnancy: [id number] at [redacted]w[redacted]d  1. Supervision of high risk pregnancy, antepartum (Primary) Provided anticipatory guidance for induction  2. [redacted] weeks gestation of pregnancy  3. Pre-existing type 2 diabetes affecting pregnancy, antepartum Has IOL scheduled for 39 weeks NST today reactive in office CBGs reviewed, PP within range, fastings all mildly elevated Given she is  being induced in 5 days, will not start medication at this point, she states they go back and forth between being within range -reviewed reasons to present to MAU  4. Obesity in pregnancy  5. Language barrier Tigrinian interpretor used  6. Short interval between pregnancies affecting pregnancy, antepartum  7. History of third degree perineal laceration   Term labor symptoms and general obstetric precautions including but not limited to vaginal bleeding, contractions, leaking of fluid and fetal movement were reviewed in detail with the patient. Please refer to After Visit Summary for other counseling recommendations.   Return in about 1 month (around 11/07/2024) for post partum check.  Future Appointments  Date Time Provider Department Center  10/12/2024  6:30 AM [facility] SCHED ROOM [facility] None    Burnard CHRISTELLA Moats, MD  "

## 2024-10-08 ENCOUNTER — Telehealth (HOSPITAL_COMMUNITY): Payer: Self-pay | Admitting: *Deleted

## 2024-10-08 ENCOUNTER — Encounter (HOSPITAL_COMMUNITY): Payer: Self-pay | Admitting: *Deleted

## 2024-10-08 NOTE — Telephone Encounter (Signed)
 Preadmission screen Interpreter number [phone]

## 2024-10-09 ENCOUNTER — Encounter: Payer: Self-pay | Admitting: Obstetrics and Gynecology

## 2024-10-12 ENCOUNTER — Inpatient Hospital Stay (HOSPITAL_COMMUNITY)

## 2024-10-12 ENCOUNTER — Inpatient Hospital Stay (HOSPITAL_COMMUNITY): Admission: AD | Admit: 2024-10-12 | Payer: Self-pay | Source: Home / Self Care

## 2024-10-12 ENCOUNTER — Encounter (HOSPITAL_COMMUNITY): Payer: Self-pay | Admitting: Obstetrics and Gynecology

## 2024-10-12 ENCOUNTER — Other Ambulatory Visit: Payer: Self-pay

## 2024-10-12 DIAGNOSIS — Z3A39 39 weeks gestation of pregnancy: Secondary | ICD-10-CM

## 2024-10-12 DIAGNOSIS — O99210 Obesity complicating pregnancy, unspecified trimester: Secondary | ICD-10-CM | POA: Diagnosis present

## 2024-10-12 DIAGNOSIS — O99824 Streptococcus B carrier state complicating childbirth: Secondary | ICD-10-CM | POA: Diagnosis present

## 2024-10-12 DIAGNOSIS — O2412 Pre-existing diabetes mellitus, type 2, in childbirth: Secondary | ICD-10-CM | POA: Diagnosis present

## 2024-10-12 DIAGNOSIS — Z603 Acculturation difficulty: Secondary | ICD-10-CM | POA: Diagnosis present

## 2024-10-12 DIAGNOSIS — O99214 Obesity complicating childbirth: Secondary | ICD-10-CM | POA: Diagnosis present

## 2024-10-12 DIAGNOSIS — O0930 Supervision of pregnancy with insufficient antenatal care, unspecified trimester: Secondary | ICD-10-CM

## 2024-10-12 DIAGNOSIS — O26893 Other specified pregnancy related conditions, third trimester: Secondary | ICD-10-CM | POA: Diagnosis present

## 2024-10-12 DIAGNOSIS — Z3490 Encounter for supervision of normal pregnancy, unspecified, unspecified trimester: Principal | ICD-10-CM | POA: Diagnosis present

## 2024-10-12 DIAGNOSIS — O24420 Gestational diabetes mellitus in childbirth, diet controlled: Secondary | ICD-10-CM | POA: Diagnosis present

## 2024-10-12 DIAGNOSIS — O24119 Pre-existing diabetes mellitus, type 2, in pregnancy, unspecified trimester: Secondary | ICD-10-CM | POA: Diagnosis present

## 2024-10-12 DIAGNOSIS — Z758 Other problems related to medical facilities and other health care: Secondary | ICD-10-CM | POA: Diagnosis present

## 2024-10-12 LAB — COMPREHENSIVE METABOLIC PANEL WITH GFR
ALT: 12 U/L (ref 0–44)
AST: 19 U/L (ref 15–41)
Albumin: 3.8 g/dL (ref 3.5–5.0)
Alkaline Phosphatase: 176 U/L — ABNORMAL HIGH (ref 38–126)
Anion gap: 13 (ref 5–15)
BUN: 6 mg/dL (ref 6–20)
CO2: 24 mmol/L (ref 22–32)
Calcium: 9.4 mg/dL (ref 8.9–10.3)
Chloride: 102 mmol/L (ref 98–111)
Creatinine, Ser: 0.49 mg/dL (ref 0.44–1.00)
GFR, Estimated: >60 mL/min
Glucose, Bld: 113 mg/dL — ABNORMAL HIGH (ref 70–99)
Potassium: 3.7 mmol/L (ref 3.5–5.1)
Sodium: 138 mmol/L (ref 135–145)
Total Bilirubin: 0.4 mg/dL (ref 0.0–1.2)
Total Protein: 8.3 g/dL — ABNORMAL HIGH (ref 6.5–8.1)

## 2024-10-12 LAB — TYPE AND SCREEN
ABO/RH(D): B POS
Antibody Screen: NEGATIVE

## 2024-10-12 LAB — CBC
HCT: 44.1 % (ref 36.0–46.0)
Hemoglobin: 14.9 g/dL (ref 12.0–15.0)
MCH: 27.7 pg (ref 26.0–34.0)
MCHC: 33.8 g/dL (ref 30.0–36.0)
MCV: 82 fL (ref 80.0–100.0)
Platelets: 207 10*3/uL (ref 150–400)
RBC: 5.38 MIL/uL — ABNORMAL HIGH (ref 3.87–5.11)
RDW: 14 % (ref 11.5–15.5)
WBC: 7.9 10*3/uL (ref 4.0–10.5)
nRBC: 0 % (ref 0.0–0.2)

## 2024-10-12 MED ORDER — OXYTOCIN BOLUS FROM INFUSION
333.0000 mL | Freq: Once | INTRAVENOUS | Status: AC
  Administered 2024-10-13: 333 mL via INTRAVENOUS

## 2024-10-12 MED ORDER — OXYTOCIN-SODIUM CHLORIDE 30-0.9 UT/500ML-% IV SOLN: 2.5000 [IU]/h | INTRAVENOUS | Status: DC

## 2024-10-12 MED ORDER — MISOPROSTOL 25 MCG QUARTER TABLET
25.0000 ug | ORAL_TABLET | Freq: Once | ORAL | Status: AC
  Administered 2024-10-12: 25 ug via VAGINAL
  Filled 2024-10-12: qty 1

## 2024-10-12 MED ORDER — MISOPROSTOL 25 MCG QUARTER TABLET: 25.0000 ug | ORAL_TABLET | ORAL | Status: DC

## 2024-10-12 MED ORDER — MISOPROSTOL 50MCG HALF TABLET
50.0000 ug | ORAL_TABLET | Freq: Once | ORAL | Status: AC
  Administered 2024-10-12: 50 ug via ORAL
  Filled 2024-10-12: qty 1

## 2024-10-12 MED ORDER — ONDANSETRON HCL 4 MG/2ML IJ SOLN: 4.0000 mg | Freq: Four times a day (QID) | INTRAMUSCULAR | Status: DC | PRN

## 2024-10-12 MED ORDER — LACTATED RINGERS IV SOLN: INTRAVENOUS | Status: DC

## 2024-10-12 MED ORDER — ACETAMINOPHEN 325 MG PO TABS: 650.0000 mg | ORAL_TABLET | ORAL | Status: DC | PRN

## 2024-10-12 MED ORDER — SOD CITRATE-CITRIC ACID 500-334 MG/5ML PO SOLN: 30.0000 mL | ORAL | Status: DC | PRN

## 2024-10-12 MED ORDER — TERBUTALINE SULFATE 1 MG/ML IJ SOLN: 0.2500 mg | Freq: Once | INTRAMUSCULAR | Status: DC | PRN

## 2024-10-12 MED ORDER — LIDOCAINE HCL (PF) 1 % IJ SOLN: 30.0000 mL | INTRAMUSCULAR | Status: DC | PRN

## 2024-10-12 MED ORDER — FENTANYL CITRATE (PF) 100 MCG/2ML IJ SOLN
50.0000 ug | INTRAMUSCULAR | Status: DC | PRN
  Administered 2024-10-13: 100 ug via INTRAVENOUS
  Filled 2024-10-12: qty 2

## 2024-10-12 MED ORDER — LACTATED RINGERS IV SOLN: 500.0000 mL | INTRAVENOUS | Status: DC | PRN

## 2024-10-12 NOTE — H&P (Signed)
 OBSTETRIC ADMISSION HISTORY AND PHYSICAL  Sarah Koch is 34 y.o. H5E7987 with IUP at [redacted]w[redacted]d 10/19/2024, by Ultrasound presenting for IOL. She received her prenatal care at [facility]  Prenatal History/Complications NURSING  PROVIDER  Office Location [facility] Dating by US  at 32+4  [facility] Model Traditional Anatomy U/S  normal  Initiated care at  14wks                 Language  Tigrinian               LAB RESULTS   Support Person Sarah Koch  Genetics NIPS: LR F AFP: none this preg      NT/IT (FT only)        Carrier Screen Horizon:  none this preg  Rhogam  B/Positive/-- (08/19 1132) A1C/GTT Early HgbA1C: 5.3 Third trimester 2 hr GTT: elevated fasting, 1h & 2h nl  Flu Vaccine        TDaP Vaccine Given 08-13-24 Blood Type B/Positive/-- (08/19 1132)  RSV Vaccine   Antibody Negative (08/19 1132)  COVID Vaccine   Rubella 3.28 (08/19 1132)  Feeding Plan BREASTFEED RPR Non Reactive (08/19 1132)  Contraception  Nexplanon in hospital HBsAg Negative (08/19 1132)  Circumcision  YES  HIV Non Reactive (08/19 1132)  Pediatrician   [facility] HCVAb Non Reactive (08/19 1132)  Prenatal Classes        BTL Consent   Pap       Diagnosis  Date Value Ref Range Status  12/14/2021     Final    - Negative for intraepithelial lesion or malignancy (NILM)    BTL Pre-payment   GC/CT Initial:   36wks:    VBAC Consent   GBS For PCN allergy, check sensitivities   BRx Optimized? [ ]  yes   [X]  no      DME Rx [X]  BP cuff [ ]  Weight Scale Waterbirth  [ ]  Class [ ]  Consent [ ]  CNM visit  PHQ9 & GAD7 [X]  new OB [  ] 28 weeks  [  ] 36 weeks Induction  [ ]  Orders Entered [ ] Foley Y/N    OB History  Gravida Para Term Preterm AB Living  4 2 2  1 2   SAB IAB Ectopic Multiple Live Births  1 0 0 0 2    # Outcome Date GA Lbr Len/2nd Weight Sex Type Anes PTL Lv  4 Current           3 Term 03/29/23 [redacted]w[redacted]d / 00:13 3310 g M Vag-Spont EPI  LIV  2 Term 11/02/21 [redacted]w[redacted]d / 02:55 3660 g F Vag-Spont EPI  LIV  1 SAB             Patient Active Problem List   Diagnosis Date Noted   Encounter for induction of labor 10/12/2024   Non-reactive NST (non-stress test) 09/16/2024   Pre-existing type 2 diabetes affecting pregnancy, antepartum 07/11/2024   History of third degree perineal laceration 06/13/2024   Late prenatal care affecting pregnancy 06/13/2024   Short interval between pregnancies affecting pregnancy, antepartum 05/06/2024   Language barrier 08/25/2021   Obesity in pregnancy 05/24/2021   Medications Prior to Admission  Medication Sig Dispense Refill Last Dose/Taking   Accu-Chek Softclix Lancets lancets Check blood sugars four times daily; when waking up and 2 hours after breakfast, lunch, and dinner. 100 each 12    acetaminophen  (TYLENOL ) 325 MG tablet Take 2 tablets (650 mg total) by mouth every 4 (four) hours as needed (  for pain scale < 4). 30 tablet 0    Blood Glucose Monitoring Suppl (ACCU-CHEK GUIDE) w/Device KIT 1 Device by Does not apply route 4 (four) times daily. 1 kit 0    cetirizine  (ZYRTEC ) 5 MG tablet Take 1 tablet (5 mg total) by mouth daily. (Patient not taking: Reported on 10/07/2024) 30 tablet 3    glucose blood test strip Check blood sugars four times daily; when waking up and 2 hours after breakfast, lunch, and dinner. 100 each 12    hydrocortisone  2.5 % cream Apply topically 2 (two) times daily. 30 g 2    Prenatal Vit-Fe Fum-Fe Bisg-FA (NATACHEW ) 28-1 MG CHEW Chew 1 each by mouth daily. 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  Drysol was ineffective.  Await lab results.  If not pregnant, would  consider a trial of oxybutynin 5 mg 3 times daily.   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   Past Surgical History: Past Surgical History:  Procedure Laterality Date   NO PAST SURGERIES     Social History Social History   Socioeconomic History   Marital status: Single    Spouse name: Not on file   Number of children: Not on file   Years of education: Not on file   Highest education level: Not on file  Occupational History   Not on file  Tobacco Use   Smoking status: Never   Smokeless tobacco: Never  Vaping Use   Vaping status: Never Used  Substance and Sexual Activity   Alcohol use: No    Alcohol/week: 0.0 standard drinks of alcohol   Drug use: No   Sexual activity: Yes    Partners: Male    Birth control/protection: None  Other Topics Concern   Not on file  Social History Narrative   Not on file   Social Drivers of Health   Tobacco Use: Low Risk (10/08/2024)   Patient History    Smoking Tobacco Use: Never    Smokeless Tobacco Use: Never    Passive Exposure: Not on file  Financial Resource Strain: Not on file  Food Insecurity: No Food Insecurity (06/26/2024)   Epic    Worried About Programme Researcher, Broadcasting/film/video in the Last Year: Never true    Ran Out of Food in the Last Year: Never true  Transportation Needs: No Transportation Needs (03/28/2023)   PRAPARE - Administrator, Civil Service (Medical): No    Lack of Transportation (Non-Medical): No  Physical Activity: Not on file  Stress: Not on file  Social Connections: Unknown (01/30/2022)   Received [facility]   Social Network    Social Network: Not on file  Depression (PHQ2-9): Low Risk (08/13/2024)   Depression (PHQ2-9)    PHQ-2 Score: 0  Alcohol Screen: Not on file  Housing: Low Risk (03/28/2023)   Housing    Last Housing Risk Score: 0  Utilities: Not At Risk  (03/28/2023)   AHC Utilities  Threatened with loss of utilities: No  Health Literacy: Not on file   Family History: Family History  Problem Relation Age of Onset   Asthma Neg Hx    Cancer Neg Hx    Diabetes Neg Hx    Heart disease Neg Hx    Hypertension Neg Hx     Review of Systems  All systems reviewed and negative except as stated in HPI   PHYSICAL EXAM Blood pressure (!) 100/49, pulse (!) 109, height 5' 2 (1.575 m), weight 93.9 kg, last menstrual period 01/26/2024, not currently breastfeeding. General appearance: alert and cooperative Lungs: respirations nonlabored Heart: slight tachycardia Abdomen: gravid  Fetal monitoringBaseline: 140 bpm, Variability: Good {> 6 bpm), Accelerations: Reactive, and Decelerations: Absent Uterine activity: irregular vs UI  Dilation: 1 Effacement (%): 30 Station: -2 Exam by:: Danny, MD Presentation: cephalic  Prenatal labs: ABO, Rh: B/Positive/-- (08/19 1132) Antibody: Negative (08/19 1132) Rubella: 3.28 (08/19 1132) RPR: Non Reactive (08/19 1132)  HBsAg: Negative (08/19 1132)  HIV: Non Reactive (08/19 1132)   Lab Results  Component Value Date   GBS Negative 09/30/2024    Anatomy US : limited by advanced GA 32+4  Immunization History  Administered Date(s) Administered   Hepatitis B, ADULT 07/13/2015, 08/11/2015, 11/04/2015   MMR 07/13/2015, 08/11/2015   Tdap 03/06/2016, 08/13/2024    No results found for this or any previous visit (from the past 24 hours).  Patient Active Problem List   Diagnosis Date Noted   Encounter for induction of labor 10/12/2024   Non-reactive NST (non-stress test) 09/16/2024   Pre-existing type 2 diabetes affecting pregnancy, antepartum 07/11/2024   History of third degree perineal laceration 06/13/2024   Late prenatal care affecting pregnancy 06/13/2024   Short interval between pregnancies affecting pregnancy, antepartum 05/06/2024   Language barrier 08/25/2021   Obesity in pregnancy  05/24/2021    Prenatal Transfer Tool  Maternal Diabetes: Yes:  Diabetes Type:  Pre-pregnancy, Diet controlled Genetic Screening: Normal Maternal Ultrasounds/Referrals: Normal Fetal Ultrasounds or other Referrals:  Fetal echo Maternal Substance Abuse:  No Significant Maternal Medications:  None Significant Maternal Lab Results: Group B Strep negative Number of Prenatal Visits:greater than 3 verified prenatal visits Maternal Vaccinations:TDap Other Comments:  None   ASSESSMENT & PLAN MADELYNNE LASKER is 34 y.o. H5E7987 with IUP at [redacted]w[redacted]d 10/19/2024, by Ultrasound admitted for IOL in the setting of gDM.  Sono at 37+1: normal anatomy, cephalic presentation, anterior placenta, EFW 3034g (48%)  #Labor: IOL, Cooks catheter placed and Dmiso given #Pain: Per patient preference, encourage ambulation #FWB: Cat I  #gDM gDM vs DM2. H/o gDM and failed postpartum GTT after last pregnancy. This pregnancy, A1c at 16+2= 5.3, 2hGTT at 21+5: 95/131/96.  - AGA as above - fetal echo at [facility] 08/12/2024, normal  #language barrier affecting health care - speaks Tigrinian, interpreters are difficult to reach   #GBS status:  negative #Feeding: Breastmilk  #Reproductive Life planning: Nexplanon #Circ:  not applicable   Barabara Danny, DO FM-OB Fellow [facility] [employer]

## 2024-10-13 ENCOUNTER — Inpatient Hospital Stay (HOSPITAL_COMMUNITY): Admitting: Anesthesiology

## 2024-10-13 ENCOUNTER — Encounter (HOSPITAL_COMMUNITY): Payer: Self-pay | Admitting: Obstetrics and Gynecology

## 2024-10-13 LAB — GLUCOSE, CAPILLARY
Glucose-Capillary: 101 mg/dL — ABNORMAL HIGH (ref 70–99)
Glucose-Capillary: 87 mg/dL (ref 70–99)
Glucose-Capillary: 87 mg/dL (ref 70–99)
Glucose-Capillary: 97 mg/dL (ref 70–99)

## 2024-10-13 LAB — SYPHILIS: RPR W/REFLEX TO RPR TITER AND TREPONEMAL ANTIBODIES, TRADITIONAL SCREENING AND DIAGNOSIS ALGORITHM: RPR Ser Ql: NONREACTIVE

## 2024-10-13 MED ORDER — LACTATED RINGERS IV SOLN: 500.0000 mL | Freq: Once | INTRAVENOUS | Status: DC

## 2024-10-13 MED ORDER — BENZOCAINE-MENTHOL 20-0.5 % EX AERO: 1.0000 | INHALATION_SPRAY | CUTANEOUS | Status: DC | PRN

## 2024-10-13 MED ORDER — COCONUT OIL OIL: 1.0000 | TOPICAL_OIL | Status: DC | PRN

## 2024-10-13 MED ORDER — WITCH HAZEL-GLYCERIN EX PADS: 1.0000 | MEDICATED_PAD | CUTANEOUS | Status: DC | PRN

## 2024-10-13 MED ORDER — DIPHENHYDRAMINE HCL 50 MG/ML IJ SOLN: 12.5000 mg | INTRAMUSCULAR | Status: DC | PRN

## 2024-10-13 MED ORDER — IBUPROFEN 600 MG PO TABS
600.0000 mg | ORAL_TABLET | Freq: Four times a day (QID) | ORAL | Status: DC
  Administered 2024-10-13 – 2024-10-15 (×8): 600 mg via ORAL
  Filled 2024-10-13 (×8): qty 1

## 2024-10-13 MED ORDER — EPHEDRINE 5 MG/ML INJ: 10.0000 mg | INTRAVENOUS | Status: DC | PRN

## 2024-10-13 MED ORDER — OXYCODONE HCL 5 MG PO TABS: 10.0000 mg | ORAL_TABLET | ORAL | Status: DC | PRN

## 2024-10-13 MED ORDER — ONDANSETRON HCL 4 MG PO TABS: 4.0000 mg | ORAL_TABLET | ORAL | Status: DC | PRN

## 2024-10-13 MED ORDER — DOCUSATE SODIUM 100 MG PO CAPS
100.0000 mg | ORAL_CAPSULE | Freq: Two times a day (BID) | ORAL | Status: DC
  Administered 2024-10-13 – 2024-10-15 (×4): 100 mg via ORAL
  Filled 2024-10-13 (×4): qty 1

## 2024-10-13 MED ORDER — ACETAMINOPHEN 325 MG PO TABS
650.0000 mg | ORAL_TABLET | ORAL | Status: DC | PRN
  Administered 2024-10-13 – 2024-10-14 (×2): 650 mg via ORAL
  Filled 2024-10-13 (×2): qty 2

## 2024-10-13 MED ORDER — TERBUTALINE SULFATE 1 MG/ML IJ SOLN: 0.2500 mg | Freq: Once | INTRAMUSCULAR | Status: DC | PRN

## 2024-10-13 MED ORDER — PHENYLEPHRINE 80 MCG/ML (10ML) SYRINGE FOR IV PUSH (FOR BLOOD PRESSURE SUPPORT): 80.0000 ug | PREFILLED_SYRINGE | INTRAVENOUS | Status: DC | PRN

## 2024-10-13 MED ORDER — LIDOCAINE-EPINEPHRINE (PF) 2 %-1:200000 IJ SOLN
INTRAMUSCULAR | Status: DC | PRN
  Administered 2024-10-13: 3 mL via EPIDURAL

## 2024-10-13 MED ORDER — SIMETHICONE 80 MG PO CHEW: 80.0000 mg | CHEWABLE_TABLET | ORAL | Status: DC | PRN

## 2024-10-13 MED ORDER — PRENATAL MULTIVITAMIN CH
1.0000 | ORAL_TABLET | Freq: Every day | ORAL | Status: DC
  Administered 2024-10-14 – 2024-10-15 (×2): 1 via ORAL
  Filled 2024-10-13 (×2): qty 1

## 2024-10-13 MED ORDER — DIPHENHYDRAMINE HCL 25 MG PO CAPS: 25.0000 mg | ORAL_CAPSULE | Freq: Four times a day (QID) | ORAL | Status: DC | PRN

## 2024-10-13 MED ORDER — LIDOCAINE HCL (PF) 1 % IJ SOLN
INTRAMUSCULAR | Status: DC | PRN
  Administered 2024-10-13: 3 mL via SUBCUTANEOUS

## 2024-10-13 MED ORDER — OXYTOCIN-SODIUM CHLORIDE 30-0.9 UT/500ML-% IV SOLN
1.0000 m[IU]/min | INTRAVENOUS | Status: DC
  Administered 2024-10-13: 2 m[IU]/min via INTRAVENOUS
  Filled 2024-10-13: qty 500

## 2024-10-13 MED ORDER — SODIUM CHLORIDE 0.9 % IV SOLN
INTRAVENOUS | Status: DC | PRN
  Administered 2024-10-13: 6 mL via EPIDURAL

## 2024-10-13 MED ORDER — FENTANYL-BUPIVACAINE-NACL 0.5-0.125-0.9 MG/250ML-% EP SOLN
EPIDURAL | Status: AC
  Filled 2024-10-13: qty 250

## 2024-10-13 MED ORDER — FENTANYL-BUPIVACAINE-NACL 0.5-0.125-0.9 MG/250ML-% EP SOLN
12.0000 mL/h | EPIDURAL | Status: DC | PRN
  Administered 2024-10-13: 12 mL/h via EPIDURAL

## 2024-10-13 MED ORDER — OXYCODONE HCL 5 MG PO TABS
5.0000 mg | ORAL_TABLET | ORAL | Status: DC | PRN
  Administered 2024-10-15: 5 mg via ORAL
  Filled 2024-10-13: qty 1

## 2024-10-13 MED ORDER — ONDANSETRON HCL 4 MG/2ML IJ SOLN: 4.0000 mg | INTRAMUSCULAR | Status: DC | PRN

## 2024-10-13 MED ORDER — TETANUS-DIPHTH-ACELL PERTUSSIS 5-2-15.5 LF-MCG/0.5 IM SUSP: 0.5000 mL | Freq: Once | INTRAMUSCULAR | Status: DC

## 2024-10-13 MED ORDER — DIBUCAINE (PERIANAL) 1 % EX OINT: 1.0000 | TOPICAL_OINTMENT | CUTANEOUS | Status: DC | PRN

## 2024-10-13 NOTE — Progress Notes (Addendum)
 Labor Progress Note Sarah Koch is a 34 y.o. H5E7987 at [redacted]w[redacted]d presented for IOL 2/2 A1GDM  O:  BP 108/81   Pulse 98   Temp 99.2 F (37.3 C) (Oral)   Resp 16   Ht 5' 2 (1.575 m)   Wt 93.9 kg   LMP 01/26/2024 (Exact Date)   SpO2 100%   BMI 37.86 kg/m   CVE: Dilation: 7.5 Effacement (%): 80 Station: -1 Presentation: Vertex Exam by:: Caniyah W,RN   A&P: 34 y.o. H5E7987 [redacted]w[redacted]d  #Labor: Good cervical change, s/p dual cytotec  @1903  and AROM 0045. Contractions now spaced out, will start pitocin  2x2 per protocol #Pain: epidural #FWB: Cat 1  Sarah Scriver, DO 9:39 AM  Attestation of Supervision of Student:  I confirm that I have verified the information documented in the resident's note and that I have also personally performed the history, physical exam and all medical decision making activities.  I have verified that all services and findings are accurately documented in this student's note; and I agree with management and plan as outlined in the documentation. I have also made any necessary editorial changes.    Sarah LITTIE Angles, MD OB Fellow 10/13/2024 9:42 AM

## 2024-10-13 NOTE — Anesthesia Preprocedure Evaluation (Signed)
"                                    Anesthesia Evaluation  Patient identified by MRN, date of birth, ID band Patient awake    Reviewed: Allergy & Precautions, NPO status , Patient's Chart, lab work & pertinent test results  History of Anesthesia Complications Negative for: history of anesthetic complications  Airway Mallampati: II  TM Distance: >3 FB Neck ROM: Full    Dental no notable dental hx. (+) Teeth Intact   Pulmonary neg pulmonary ROS, neg sleep apnea, neg COPD, Patient abstained from smoking.Not current smoker   Pulmonary exam normal breath sounds clear to auscultation       Cardiovascular Exercise Tolerance: Good METS(-) hypertension(-) CAD and (-) Past MI negative cardio ROS (-) dysrhythmias  Rhythm:Regular Rate:Normal - Systolic murmurs    Neuro/Psych negative neurological ROS  negative psych ROS   GI/Hepatic ,neg GERD  ,,(+)     (-) substance abuse    Endo/Other  diabetes, Gestational    Renal/GU negative Renal ROS     Musculoskeletal   Abdominal  (+) + obese  Peds  Hematology Denies blood thinner use or bleeding disorders.    Anesthesia Other Findings Denies blood thinner use or bleeding diatheses. Recent labs reviewed. Past Medical History: 10/13/2021: GBS (group B Streptococcus carrier), +RV culture,  currently pregnant 08/15/2021: Gestational diabetes mellitus (GDM) in third trimester 11/09/2022: History of gestational diabetes in prior pregnancy,  currently pregnant 10/21/2018: Hyperhidrosis of axilla     Comment:  Last Assessment & Plan:   Formatting of this note might               be different from the original.  Drysol was ineffective.               Await lab results.  If not pregnant, would consider a               trial of oxybutynin 5 mg 3 times daily. No date: Medical history non-contributory No date: PONV (postoperative nausea and vomiting) 11/02/2021: Third degree laceration of perineum, type 3a 01/04/2016:  Varicose vein of leg   Reproductive/Obstetrics (+) Pregnancy                              Anesthesia Physical Anesthesia Plan  ASA: 2  Anesthesia Plan: Epidural   Post-op Pain Management:    Induction:   PONV Risk Score and Plan: 2 and Treatment may vary due to age or medical condition and Ondansetron   Airway Management Planned: Natural Airway  Additional Equipment:   Intra-op Plan:   Post-operative Plan:   Informed Consent: I have reviewed the patients History and Physical, chart, labs and discussed the procedure including the risks, benefits and alternatives for the proposed anesthesia with the patient or authorized representative who has indicated his/her understanding and acceptance.     Interpreter used for interview (audio Tigrinian interpreter utilized)  Plan Discussed with: Surgeon  Anesthesia Plan Comments: (Discussed R/B/A of neuraxial anesthesia technique with patient: - rare risks of spinal/epidural hematoma, nerve damage, infection - Risk of PDPH - Risk of itching - Risk of nausea and vomiting - Risk of poor block necessitating replacement of epidural. - Risk of allergic reactions. Patient voiced understanding.)        Anesthesia Quick Evaluation  "

## 2024-10-13 NOTE — Anesthesia Procedure Notes (Signed)
 Epidural Patient location during procedure: OB Start time: 10/13/2024 4:43 AM End time: 10/13/2024 4:50 AM  Staffing Anesthesiologist: Boone Fess, MD Performed: anesthesiologist   Preanesthetic Checklist Completed: patient identified, IV checked, site marked, risks and benefits discussed, surgical consent, monitors and equipment checked, pre-op evaluation and timeout performed  Epidural Patient position: sitting Prep: ChloraPrep Patient monitoring: heart rate, continuous pulse ox and blood pressure Approach: midline Location: L3-L4 Injection technique: LOR saline  Needle:  Needle type: Tuohy  Needle gauge: 17 G Needle length: 9 cm Needle insertion depth: 5 cm Catheter type: closed end flexible Catheter size: 19 Gauge Catheter at skin depth: 11 cm Test dose: negative and 1.5% lidocaine  with Epi 1:200 K  Assessment Sensory level: T10 Events: blood not aspirated, no cerebrospinal fluid, injection not painful, no injection resistance, no paresthesia and negative IV test  Additional Notes First/one attempt Pt. Evaluated and documentation done after procedure finished. Patient identified. Risks/Benefits/Options discussed with patient including but not limited to bleeding, infection, nerve damage, paralysis, failed block, incomplete pain control, headache, blood pressure changes, nausea, vomiting, reactions to medication both or allergic, itching and postpartum back pain. Confirmed with bedside nurse the patient's most recent platelet count. Confirmed with patient that they are not currently taking any anticoagulation, have any bleeding history or any family history of bleeding disorders. Patient expressed understanding and wished to proceed. All questions were answered. Sterile technique was used throughout the entire procedure. Please see nursing notes for vital signs. Test dose was given through epidural catheter and negative prior to continuing to dose epidural or start infusion.  Warning signs of high block given to the patient including shortness of breath, tingling/numbness in hands, complete motor block, or any concerning symptoms with instructions to call for help. Patient was given instructions on fall risk and not to get out of bed. All questions and concerns addressed with instructions to call with any issues or inadequate analgesia.     Patient tolerated the insertion well without immediate complications.  Reason for block: procedure for painReason for block:procedure for pain

## 2024-10-13 NOTE — Discharge Summary (Signed)
 "    Postpartum Discharge Summary  Date of Service updated***     Patient Name: Sarah Koch DOB: 07-Oct-1990 MRN: [id number]  Date of admission: 10/12/2024 Delivery date:10/13/2024 Delivering provider: ELDONNA SUZEN OCTAVE Date of discharge: 10/13/2024  Admitting diagnosis: Encounter for induction of labor [Z34.90] Intrauterine pregnancy: [redacted]w[redacted]d     Secondary diagnosis:  Principal Problem:   SVD (spontaneous vaginal delivery) Active Problems:   Obesity in pregnancy   Language barrier   Late prenatal care affecting pregnancy   Pre-existing type 2 diabetes affecting pregnancy, antepartum   Encounter for induction of labor  Additional problems: ***    Discharge diagnosis: {DX.:23714}                                              Post partum procedures:{Postpartum procedures:23558} Augmentation: {Augmentation:20782} Complications: {OB Labor/Delivery Complications:20784}  Hospital course: {Courses:23701}  Magnesium Sulfate received: {Mag received:30440022} BMZ received: {BMZ received:30440023} Rhophylac:{Rhophylac received:30440032} FFM:{FFM:69559966} T-DaP:{Tdap:23962} Flu: {Qol:76036} RSV Vaccine received: {RSV:31013} Transfusion:{Transfusion received:30440034}  Immunizations received: Immunization History  Administered Date(s) Administered   Hepatitis B, ADULT 07/13/2015, 08/11/2015, 11/04/2015   MMR 07/13/2015, 08/11/2015   Tdap 03/06/2016, 08/13/2024    Physical exam  Vitals:   10/13/24 1501 10/13/24 1505 10/13/24 1530 10/13/24 1531  BP: (!) 96/59  118/80 93/77  Pulse: 97  (!) 122   Resp:      Temp:      TempSrc:      SpO2:  98%    Weight:      Height:       General: {Exam; general:21111117} Lochia: {Desc; appropriate/inappropriate:30686::appropriate} Uterine Fundus: {Desc; firm/soft:30687} Incision: {Exam; incision:21111123} DVT Evaluation: {Exam; dvt:2111122} Labs: Lab Results  Component Value Date   WBC 7.9 10/12/2024   HGB 14.9 10/12/2024    HCT 44.1 10/12/2024   MCV 82.0 10/12/2024   PLT 207 10/12/2024      Latest Ref Rng & Units 10/12/2024    6:39 PM  CMP  Glucose 70 - 99 mg/dL 886   BUN 6 - 20 mg/dL 6   Creatinine 9.55 - 8.99 mg/dL 9.50   Sodium 864 - 854 mmol/L 138   Potassium 3.5 - 5.1 mmol/L 3.7   Chloride 98 - 111 mmol/L 102   CO2 22 - 32 mmol/L 24   Calcium 8.9 - 10.3 mg/dL 9.4   Total Protein 6.5 - 8.1 g/dL 8.3   Total Bilirubin 0.0 - 1.2 mg/dL 0.4   Alkaline Phos 38 - 126 U/L 176   AST 15 - 41 U/L 19   ALT 0 - 44 U/L 12    Edinburgh Score:    06/19/2023    9:19 AM  Edinburgh Postnatal Depression Scale Screening Tool  I have been able to laugh and see the funny side of things. 0   I have looked forward with enjoyment to things. 0   I have blamed myself unnecessarily when things went wrong. 0   I have been anxious or worried for no good reason. 0   I have felt scared or panicky for no good reason. 0   Things have been getting on top of me. 0   I have been so unhappy that I have had difficulty sleeping. 0   I have felt sad or miserable. 0   I have been so unhappy that I have been crying. 0   The thought of  harming myself has occurred to me. 0   Edinburgh Postnatal Depression Scale Total 0      Data saved with a previous flowsheet row definition   No data recorded  After visit meds:  Allergies as of 10/13/2024       Reactions   Porcine (pork) Protein-containing Drug Products      Med Rec must be completed prior to using this [facility]***        Discharge home in stable condition Infant Feeding: {Baby feeding:23562} Infant Disposition:{CHL IP OB HOME WITH FNUYZM:76418} Discharge instruction: per After Visit Summary and Postpartum booklet. Activity: Advance as tolerated. Pelvic rest for 6 weeks.  Diet: {OB ipzu:78888878} Future Appointments: Future Appointments  Date Time Provider Department Center  11/10/2024  3:10 PM Constant, Winton, MD [facility] None   Follow up Visit:  Sent on  10/13/24 by Suzen newton  Please schedule this patient for a In person postpartum visit in 4 weeks with the following provider: Any provider. Additional Postpartum F/U:PCP referral for T2DM  High risk pregnancy complicated by: GDM Delivery mode:  Vaginal, Spontaneous Anticipated Birth Control:  Nexplanon   10/13/2024 Suzen Maryan Masters, MD    "

## 2024-10-13 NOTE — Progress Notes (Signed)
 Patient ID: Sarah Koch, female   DOB: May 10, 1991, 34 y.o.   MRN: [id number]  Blood pressure 115/76, pulse 99, temperature 97.6 F (36.4 C), temperature source Oral, resp. rate 16, height 5' 2 (1.575 m), weight 93.9 kg, last menstrual period 01/26/2024, not currently breastfeeding.  FHT: baseline 145/moderate variability/+accels/-decels Toco: ctx q2-20min  Dilation: 6.5 Effacement (%): 80 Station: -1 Presentation: Vertex Exam by:: Caniyah W,RN  Seen at bedside during RN cervical exam, continuing to progress after AROM. Patient requesting epidural for pain management. No questions at this time. Tigrinian interpreter used during encounter.  Charlie DELENA Courts, MD

## 2024-10-13 NOTE — Progress Notes (Signed)
 Patient ID: Sarah Koch, female   DOB: 1991-05-29, 34 y.o.   MRN: [id number]  Blood pressure 138/81, pulse 89, height 5' 2 (1.575 m), weight 93.9 kg, last menstrual period 01/26/2024, not currently breastfeeding.  FHT: baseline 145/moderate variability/+accels/-decels Toco:ctx q1-48min  Dilation: 4.5 Effacement (%): 40 Station: -2 Presentation: Vertex Exam by:: Caniyah W,RN  Seen at bedside with RN and assistance of Tigrinian interpreter via telephone for AROM. Foley balloon out, SVE 4.5/40/-2, contracting q1-26min. Amenable to AROM with medication. 100mcg fentanyl  given. AROM successful with AmniHook, clear fluid appreciated. Will continue to monitor closely.  Sarah DELENA Courts, MD

## 2024-10-14 LAB — CBC
HCT: 34.9 % — ABNORMAL LOW (ref 36.0–46.0)
Hemoglobin: 11.7 g/dL — ABNORMAL LOW (ref 12.0–15.0)
MCH: 27.9 pg (ref 26.0–34.0)
MCHC: 33.5 g/dL (ref 30.0–36.0)
MCV: 83.1 fL (ref 80.0–100.0)
Platelets: 161 10*3/uL (ref 150–400)
RBC: 4.2 MIL/uL (ref 3.87–5.11)
RDW: 14.2 % (ref 11.5–15.5)
WBC: 8.8 10*3/uL (ref 4.0–10.5)
nRBC: 0 % (ref 0.0–0.2)

## 2024-10-14 NOTE — Progress Notes (Signed)
 POSTPARTUM PROGRESS NOTE  Post Partum Day #1  Subjective:  Sarah Koch is a 33 y.o. H5E6986 s/p NSVD at [redacted]w[redacted]d.  No acute events overnight.  Pt denies problems with ambulating, voiding or po intake.  She denies nausea or vomiting.  Pain is well controlled.  She has had flatus. She has had bowel movement.  Lochia Small.   Objective: Blood pressure 108/69, pulse 85, temperature 97.8 F (36.6 C), temperature source Oral, resp. rate 18, height 5' 2 (1.575 m), weight 93.9 kg, last menstrual period 01/26/2024, SpO2 100%, unknown if currently breastfeeding.  Physical Exam:  General: alert, cooperative and no distress Chest: no respiratory distress Heart:regular rate Abdomen: soft, nontender,  Uterine Fundus: firm, appropriately tender DVT Evaluation: No calf swelling or tenderness Extremities: no edema Skin: warm, dry  Recent Labs    10/12/24 1839 10/14/24 0446  HGB 14.9 11.7*  HCT 44.1 34.9*    Assessment/Plan: Sarah Koch is a 34 y.o. H5E6986 s/p NSVD at [redacted]w[redacted]d   PPD#1 - Doing well.   1. Continue routine postpartum care  2. Infant feeding status: breast feeding - Lactation consult PRN for breastfeeding   3. Contraception plan: Nexplanon  4. Acute blood loss anemia - clinically not significant .  -Hemodynamically stable and asymptomatic -Intervention: start on oral supplementation with ferrous sulfate  325 mg  and no intervention   5. Declined Tdap   [employer] iPad interpreter was used for this assessment: [place] #[id number]  Dispo: Plan for discharge tomorrow.   LOS: 2 days   Zia Najera, CNM, CNM 10/14/2024, 10:40 AM

## 2024-10-14 NOTE — Anesthesia Postprocedure Evaluation (Signed)
"   Anesthesia Post Note  Patient: Sarah Koch  Procedure(s) Performed: AN AD HOC LABOR EPIDURAL     Patient location during evaluation: Mother Baby Anesthesia Type: Epidural Level of consciousness: awake and alert and oriented Pain management: satisfactory to patient Vital Signs Assessment: post-procedure vital signs reviewed and stable Respiratory status: respiratory function stable Cardiovascular status: stable Postop Assessment: no headache, no backache, epidural receding, patient able to bend at knees, no signs of nausea or vomiting, adequate PO intake and able to ambulate Anesthetic complications: no   No notable events documented.  Last Vitals:  Vitals:   10/14/24 0203 10/14/24 0558  BP: 93/68 108/69  Pulse: 73 85  Resp: 17 18  Temp: 37.1 C 36.6 C  SpO2: 99% 100%    Last Pain:  Vitals:   10/14/24 1119  TempSrc:   PainSc: 3    Pain Goal:                   Temia Debroux      "

## 2024-10-15 LAB — GLUCOSE, CAPILLARY: Glucose-Capillary: 120 mg/dL — ABNORMAL HIGH (ref 70–99)

## 2024-10-15 MED ORDER — IBUPROFEN 600 MG PO TABS: 600.0000 mg | ORAL_TABLET | Freq: Four times a day (QID) | ORAL | 1 refills | Status: AC

## 2024-10-15 NOTE — Plan of Care (Signed)

## 2024-10-15 NOTE — Patient Instructions (Addendum)
 If you are interested in an outpatient lactation consultation -- available in-office or virtually -- please reach out to us  at:  [facility] (First Floor) ?? [address] [place], [place]  ?? [phone] Please leave a message on our lactation voicemail box. We welcome any lactation-related questions or concerns -- our team is here to support you and your baby.  Lactation Support Groups Join us  at: [facility] ?? Tuesdays, 10:00 AM - 12:00 PM ?? [address], [employer], [employer]  Lactating parents and lap babies are welcome!  ?? [email or web address]  ?? [email or web address] ------------- ??? ???? ????? (Lactation Consultation) ?? ????? ???? (outpatient) ???? ?????? -- ?? ??? ?? ??????? (virtual) -- ??????? ??? ????:  [facility] (??? ???) ?? [address], [place], [place]  ?? [phone] ??????? ?? ???? ??? ?????? ????? ????  ??? ????? ???? ?? ????? ???? ????? ???? ???? ????? -- ???? ???? ?????? ????? ??? ???  ???? ??? ????? (Lactation Support Groups) ???? ??: [facility]  ?? ???? 10:00 ??? ??? - 12:00 ??? ??? ?? [address], ???? ???? ??? ??????  ????? ???? ????? ????? ?? ??? ????? (lap babies) ???? ???? ?????!  ?? [email or web address] ?? [email or web address]      Sarah Koch [name], [facility]

## 2024-10-15 NOTE — Plan of Care (Signed)
 " Problem: Education: Goal: Knowledge of General Education information will improve Description: Including pain rating scale, medication(s)/side effects and non-pharmacologic comfort measures 10/15/2024 1438 by Madison Rosina LABOR, LPN Outcome: Adequate for Discharge 10/15/2024 0847 by Madison Rosina LABOR, LPN Outcome: Progressing   Problem: Health Behavior/Discharge Planning: Goal: Ability to manage health-related needs will improve 10/15/2024 1438 by Madison Rosina LABOR, LPN Outcome: Adequate for Discharge 10/15/2024 0847 by Madison Rosina LABOR, LPN Outcome: Progressing   Problem: Clinical Measurements: Goal: Ability to maintain clinical measurements within normal limits will improve 10/15/2024 1438 by Madison Rosina LABOR, LPN Outcome: Adequate for Discharge 10/15/2024 0847 by Madison Rosina LABOR, LPN Outcome: Progressing Goal: Will remain free from infection 10/15/2024 1438 by Madison Rosina LABOR, LPN Outcome: Adequate for Discharge 10/15/2024 0847 by Madison Rosina LABOR, LPN Outcome: Progressing Goal: Diagnostic test results will improve 10/15/2024 1438 by Madison Rosina LABOR, LPN Outcome: Adequate for Discharge 10/15/2024 0847 by Madison Rosina LABOR, LPN Outcome: Progressing Goal: Respiratory complications will improve 10/15/2024 1438 by Madison Rosina LABOR, LPN Outcome: Adequate for Discharge 10/15/2024 0847 by Madison Rosina LABOR, LPN Outcome: Progressing Goal: Cardiovascular complication will be avoided 10/15/2024 1438 by Madison Rosina LABOR, LPN Outcome: Adequate for Discharge 10/15/2024 0847 by Madison Rosina LABOR, LPN Outcome: Progressing   Problem: Activity: Goal: Risk for activity intolerance will decrease 10/15/2024 1438 by Madison Rosina LABOR, LPN Outcome: Adequate for Discharge 10/15/2024 0847 by Madison Rosina LABOR, LPN Outcome: Progressing   Problem: Nutrition: Goal: Adequate nutrition will be maintained 10/15/2024 1438 by Madison Rosina LABOR, LPN Outcome: Adequate for Discharge 10/15/2024 0847 by Madison Rosina LABOR, LPN Outcome: Progressing    Problem: Coping: Goal: Level of anxiety will decrease 10/15/2024 1438 by Madison Rosina LABOR, LPN Outcome: Adequate for Discharge 10/15/2024 0847 by Madison Rosina LABOR, LPN Outcome: Progressing   Problem: Elimination: Goal: Will not experience complications related to bowel motility 10/15/2024 1438 by Madison Rosina LABOR, LPN Outcome: Adequate for Discharge 10/15/2024 0847 by Madison Rosina LABOR, LPN Outcome: Progressing Goal: Will not experience complications related to urinary retention 10/15/2024 1438 by Madison Rosina LABOR, LPN Outcome: Adequate for Discharge 10/15/2024 0847 by Madison Rosina LABOR, LPN Outcome: Progressing   Problem: Pain Managment: Goal: General experience of comfort will improve and/or be controlled 10/15/2024 1438 by Madison Rosina LABOR, LPN Outcome: Adequate for Discharge 10/15/2024 0847 by Madison Rosina LABOR, LPN Outcome: Progressing   Problem: Safety: Goal: Ability to remain free from injury will improve 10/15/2024 1438 by Madison Rosina LABOR, LPN Outcome: Adequate for Discharge 10/15/2024 0847 by Madison Rosina LABOR, LPN Outcome: Progressing   Problem: Skin Integrity: Goal: Risk for impaired skin integrity will decrease 10/15/2024 1438 by Madison Rosina LABOR, LPN Outcome: Adequate for Discharge 10/15/2024 0847 by Madison Rosina LABOR, LPN Outcome: Progressing   Problem: Education: Goal: Knowledge of Childbirth will improve 10/15/2024 1438 by Madison Rosina LABOR, LPN Outcome: Adequate for Discharge 10/15/2024 0847 by Madison Rosina LABOR, LPN Outcome: Progressing Goal: Ability to make informed decisions regarding treatment and plan of care will improve 10/15/2024 1438 by Madison Rosina LABOR, LPN Outcome: Adequate for Discharge 10/15/2024 0847 by Madison Rosina LABOR, LPN Outcome: Progressing Goal: Ability to state and carry out methods to decrease the pain will improve 10/15/2024 1438 by Madison Rosina LABOR, LPN Outcome: Adequate for Discharge 10/15/2024 0847 by Madison Rosina LABOR, LPN Outcome: Progressing Goal: Individualized Educational  Video(s) 10/15/2024 1438 by Madison Rosina LABOR, LPN Outcome: Adequate for Discharge 10/15/2024 0847 by Madison Rosina LABOR, LPN Outcome: Progressing   Problem: Coping: Goal: Ability to  verbalize concerns and feelings about labor and delivery will improve 10/15/2024 1438 by Madison Rosina LABOR, LPN Outcome: Adequate for Discharge 10/15/2024 0847 by Madison Rosina LABOR, LPN Outcome: Progressing   Problem: Life Cycle: Goal: Ability to make normal progression through stages of labor will improve 10/15/2024 1438 by Madison Rosina LABOR, LPN Outcome: Adequate for Discharge 10/15/2024 0847 by Madison Rosina LABOR, LPN Outcome: Progressing Goal: Ability to effectively push during vaginal delivery will improve 10/15/2024 1438 by Madison Rosina LABOR, LPN Outcome: Adequate for Discharge 10/15/2024 0847 by Madison Rosina LABOR, LPN Outcome: Progressing   Problem: Role Relationship: Goal: Will demonstrate positive interactions with the child 10/15/2024 1438 by Madison Rosina LABOR, LPN Outcome: Adequate for Discharge 10/15/2024 0847 by Madison Rosina LABOR, LPN Outcome: Progressing   Problem: Safety: Goal: Risk of complications during labor and delivery will decrease 10/15/2024 1438 by Madison Rosina LABOR, LPN Outcome: Adequate for Discharge 10/15/2024 0847 by Madison Rosina LABOR, LPN Outcome: Progressing   Problem: Pain Management: Goal: Relief or control of pain from uterine contractions will improve 10/15/2024 1438 by Madison Rosina LABOR, LPN Outcome: Adequate for Discharge 10/15/2024 0847 by Madison Rosina LABOR, LPN Outcome: Progressing   Problem: Education: Goal: Knowledge of condition will improve 10/15/2024 1438 by Madison Rosina LABOR, LPN Outcome: Adequate for Discharge 10/15/2024 0847 by Madison Rosina LABOR, LPN Outcome: Progressing Goal: Individualized Educational Video(s) 10/15/2024 1438 by Madison Rosina LABOR, LPN Outcome: Adequate for Discharge 10/15/2024 0847 by Madison Rosina LABOR, LPN Outcome: Progressing Goal: Individualized Newborn Educational Video(s) 10/15/2024  1438 by Madison Rosina LABOR, LPN Outcome: Adequate for Discharge 10/15/2024 0847 by Madison Rosina LABOR, LPN Outcome: Progressing   Problem: Activity: Goal: Will verbalize the importance of balancing activity with adequate rest periods 10/15/2024 1438 by Madison Rosina LABOR, LPN Outcome: Adequate for Discharge 10/15/2024 0847 by Madison Rosina LABOR, LPN Outcome: Progressing Goal: Ability to tolerate increased activity will improve 10/15/2024 1438 by Madison Rosina LABOR, LPN Outcome: Adequate for Discharge 10/15/2024 0847 by Madison Rosina LABOR, LPN Outcome: Progressing   Problem: Coping: Goal: Ability to identify and utilize available resources and services will improve 10/15/2024 1438 by Madison Rosina LABOR, LPN Outcome: Adequate for Discharge 10/15/2024 0847 by Madison Rosina LABOR, LPN Outcome: Progressing   Problem: Life Cycle: Goal: Chance of risk for complications during the postpartum period will decrease 10/15/2024 1438 by Madison Rosina LABOR, LPN Outcome: Adequate for Discharge 10/15/2024 0847 by Madison Rosina LABOR, LPN Outcome: Progressing   Problem: Role Relationship: Goal: Ability to demonstrate positive interaction with newborn will improve 10/15/2024 1438 by Madison Rosina LABOR, LPN Outcome: Adequate for Discharge 10/15/2024 0847 by Madison Rosina LABOR, LPN Outcome: Progressing   Problem: Skin Integrity: Goal: Demonstration of wound healing without infection will improve 10/15/2024 1438 by Madison Rosina LABOR, LPN Outcome: Adequate for Discharge 10/15/2024 0847 by Madison Rosina LABOR, LPN Outcome: Progressing   "

## 2024-10-15 NOTE — Discharge Instructions (Signed)

## 2024-10-21 ENCOUNTER — Telehealth (HOSPITAL_COMMUNITY): Payer: Self-pay | Admitting: *Deleted

## 2024-10-21 NOTE — Telephone Encounter (Signed)
 Attempted hospital discharge follow-up call with Language Line interpreter. Message received stating, Voicemail has not been set up. Allean IVAR Carton, RN, 10/21/24, [phone]

## 2024-11-10 ENCOUNTER — Ambulatory Visit: Payer: Self-pay | Admitting: Obstetrics and Gynecology

## 2024-11-14 ENCOUNTER — Encounter: Payer: Self-pay | Admitting: Obstetrics and Gynecology
